# Patient Record
Sex: Male | Born: 1960 | Race: White | Hispanic: No | Marital: Single | Smoking: Former smoker
Health system: Southern US, Community
[De-identification: ages and names within clinical notes are randomized; demographics above are authoritative.]

## PROBLEM LIST (undated history)

## (undated) DIAGNOSIS — J449 Chronic obstructive pulmonary disease, unspecified: Secondary | ICD-10-CM

## (undated) DIAGNOSIS — F909 Attention-deficit hyperactivity disorder, unspecified type: Secondary | ICD-10-CM

## (undated) DIAGNOSIS — G8929 Other chronic pain: Secondary | ICD-10-CM

## (undated) DIAGNOSIS — I517 Cardiomegaly: Secondary | ICD-10-CM

## (undated) DIAGNOSIS — F209 Schizophrenia, unspecified: Secondary | ICD-10-CM

## (undated) DIAGNOSIS — K7290 Hepatic failure, unspecified without coma: Secondary | ICD-10-CM

## (undated) DIAGNOSIS — I10 Essential (primary) hypertension: Secondary | ICD-10-CM

## (undated) DIAGNOSIS — E119 Type 2 diabetes mellitus without complications: Secondary | ICD-10-CM

## (undated) DIAGNOSIS — J45909 Unspecified asthma, uncomplicated: Secondary | ICD-10-CM

## (undated) DIAGNOSIS — M542 Cervicalgia: Secondary | ICD-10-CM

## (undated) DIAGNOSIS — N289 Disorder of kidney and ureter, unspecified: Secondary | ICD-10-CM

## (undated) DIAGNOSIS — M549 Dorsalgia, unspecified: Secondary | ICD-10-CM

## (undated) HISTORY — PX: BACK SURGERY: SHX140

## (undated) HISTORY — PX: SPINAL FUSION: SHX223

## (undated) HISTORY — PX: NECK SURGERY: SHX720

---

## 1998-04-04 ENCOUNTER — Ambulatory Visit (HOSPITAL_COMMUNITY): Admission: RE | Admit: 1998-04-04 | Discharge: 1998-04-04 | Payer: Self-pay | Admitting: Neurosurgery

## 1998-04-04 ENCOUNTER — Encounter: Payer: Self-pay | Admitting: Neurosurgery

## 1998-04-20 ENCOUNTER — Ambulatory Visit (HOSPITAL_COMMUNITY): Admission: RE | Admit: 1998-04-20 | Discharge: 1998-04-20 | Payer: Self-pay | Admitting: Neurosurgery

## 1998-04-20 ENCOUNTER — Encounter: Payer: Self-pay | Admitting: Neurosurgery

## 1998-05-08 ENCOUNTER — Ambulatory Visit (HOSPITAL_COMMUNITY): Admission: RE | Admit: 1998-05-08 | Discharge: 1998-05-08 | Payer: Self-pay | Admitting: Neurosurgery

## 1998-05-08 ENCOUNTER — Encounter: Payer: Self-pay | Admitting: Neurosurgery

## 1998-06-11 ENCOUNTER — Ambulatory Visit (HOSPITAL_COMMUNITY): Admission: RE | Admit: 1998-06-11 | Discharge: 1998-06-11 | Payer: Self-pay | Admitting: Neurosurgery

## 1998-06-11 ENCOUNTER — Encounter: Payer: Self-pay | Admitting: Neurosurgery

## 2006-03-03 ENCOUNTER — Inpatient Hospital Stay: Payer: Self-pay | Admitting: Internal Medicine

## 2006-05-31 ENCOUNTER — Inpatient Hospital Stay (HOSPITAL_COMMUNITY): Admission: RE | Admit: 2006-05-31 | Discharge: 2006-06-01 | Payer: Self-pay | Admitting: Neurosurgery

## 2006-07-04 ENCOUNTER — Encounter: Admission: RE | Admit: 2006-07-04 | Discharge: 2006-07-04 | Payer: Self-pay | Admitting: Neurosurgery

## 2006-09-20 ENCOUNTER — Encounter: Payer: Self-pay | Admitting: Neurosurgery

## 2006-10-16 ENCOUNTER — Encounter: Payer: Self-pay | Admitting: Neurosurgery

## 2006-11-28 ENCOUNTER — Encounter: Payer: Self-pay | Admitting: Neurosurgery

## 2007-01-16 ENCOUNTER — Encounter: Admission: RE | Admit: 2007-01-16 | Discharge: 2007-01-16 | Payer: Self-pay | Admitting: Neurosurgery

## 2008-11-26 ENCOUNTER — Emergency Department: Payer: Self-pay | Admitting: Emergency Medicine

## 2008-12-07 ENCOUNTER — Emergency Department (HOSPITAL_COMMUNITY): Admission: EM | Admit: 2008-12-07 | Discharge: 2008-12-07 | Payer: Self-pay | Admitting: Emergency Medicine

## 2008-12-15 IMAGING — RF DG CERVICAL SPINE 2 OR 3 VIEWS
1 series · 2 of 2 positions shown · non-contrast
Comparison: none

CLINICAL DATA: Herniated disc

Cervical spine Intraoperative:
Two spot images from intraoperative fluoroscopy document changes of anterior
cervical discectomy and fusion C4-C7. The lower cervical levels are not well
profiled.

[Series 1: run · 2 of 2 slices shown]
[im 1/2]
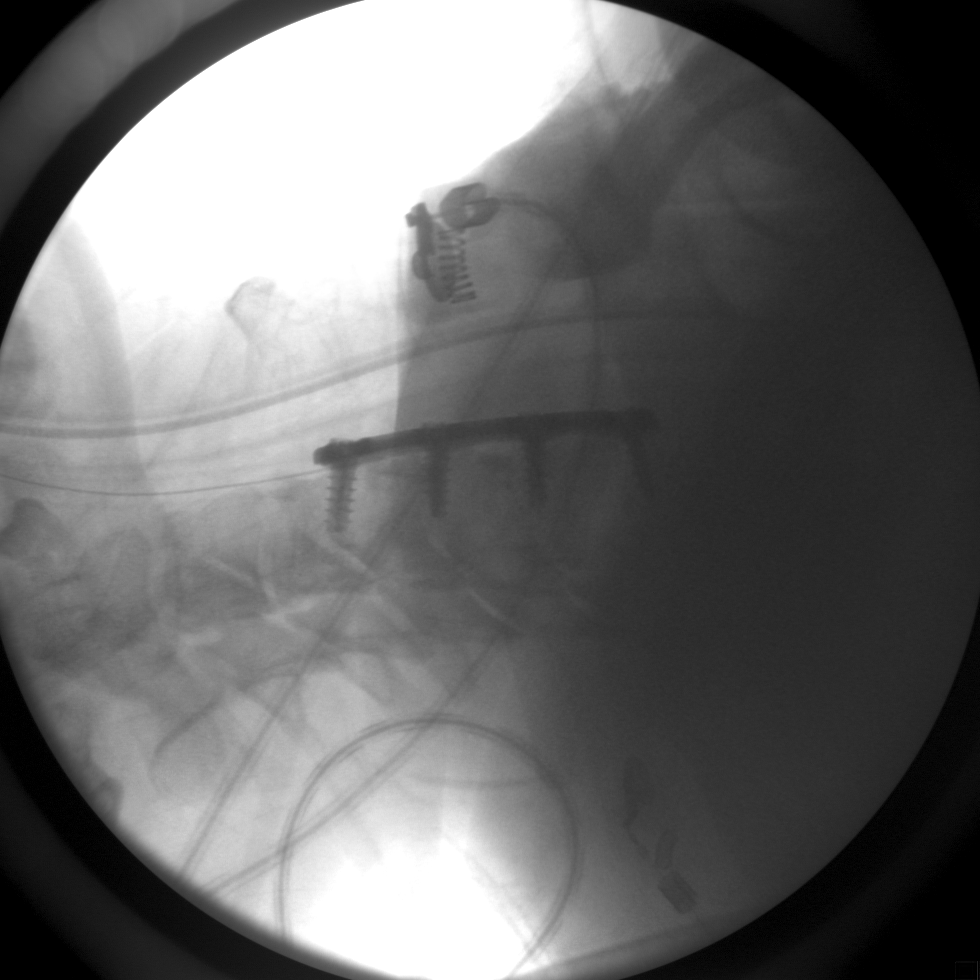
[im 2/2]
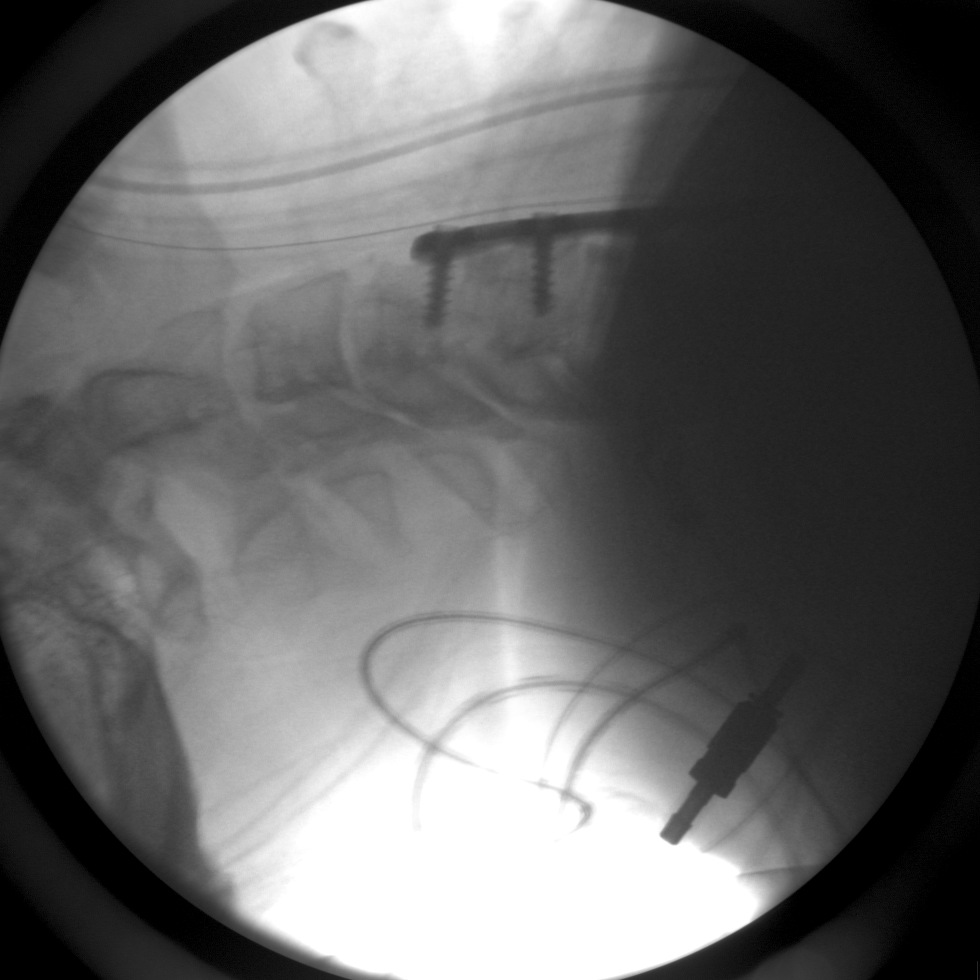

[2 of 2 positions shown; findings below may reference images not displayed]

IMPRESSION: 1. Anterior cervical discectomy and fusion C4-C7.

## 2009-01-27 ENCOUNTER — Encounter: Admission: RE | Admit: 2009-01-27 | Discharge: 2009-01-27 | Payer: Self-pay | Admitting: Neurosurgery

## 2009-04-20 ENCOUNTER — Emergency Department: Payer: Self-pay | Admitting: Internal Medicine

## 2009-09-11 ENCOUNTER — Ambulatory Visit: Payer: Self-pay | Admitting: Neurosurgery

## 2009-09-17 ENCOUNTER — Ambulatory Visit: Payer: Self-pay | Admitting: Neurosurgery

## 2009-10-26 ENCOUNTER — Ambulatory Visit: Payer: Self-pay | Admitting: Pain Medicine

## 2009-11-26 ENCOUNTER — Inpatient Hospital Stay: Payer: Self-pay | Admitting: Psychiatry

## 2009-12-22 ENCOUNTER — Emergency Department: Payer: Self-pay | Admitting: Emergency Medicine

## 2010-04-20 ENCOUNTER — Ambulatory Visit: Payer: Self-pay | Admitting: Rheumatology

## 2010-05-20 LAB — URINALYSIS, ROUTINE W REFLEX MICROSCOPIC
Glucose, UA: 100 mg/dL — AB
Ketones, ur: 15 mg/dL — AB
Specific Gravity, Urine: 1.015 (ref 1.005–1.030)
pH: 7 (ref 5.0–8.0)

## 2010-05-20 LAB — CBC
HCT: 37.5 % — ABNORMAL LOW (ref 39.0–52.0)
Hemoglobin: 13.3 g/dL (ref 13.0–17.0)
RBC: 4.04 MIL/uL — ABNORMAL LOW (ref 4.22–5.81)
WBC: 7.1 10*3/uL (ref 4.0–10.5)

## 2010-05-20 LAB — COMPREHENSIVE METABOLIC PANEL
ALT: 32 U/L (ref 0–53)
CO2: 28 mEq/L (ref 19–32)
Calcium: 9.3 mg/dL (ref 8.4–10.5)
Creatinine, Ser: 0.91 mg/dL (ref 0.4–1.5)
GFR calc non Af Amer: >60 mL/min (ref >60)
Glucose, Bld: 156 mg/dL — ABNORMAL HIGH (ref 70–99)
Total Bilirubin: 0.8 mg/dL (ref 0.3–1.2)
Total Protein: 6.7 g/dL (ref 6.0–8.3)

## 2010-05-20 LAB — DIFFERENTIAL
Basophils Absolute: 0 10*3/uL (ref 0.0–0.1)
Eosinophils Absolute: 0.1 10*3/uL (ref 0.0–0.7)
Eosinophils Relative: 2 % (ref 0–5)
Lymphocytes Relative: 21 % (ref 12–46)
Monocytes Absolute: 0.6 10*3/uL (ref 0.1–1.0)
Monocytes Relative: 9 % (ref 3–12)

## 2010-07-02 NOTE — Op Note (Signed)
Matthew Montes, Matthew Montes               ACCOUNT NO.:  [id number]   MEDICAL RECORD NO.:  [id number]          PATIENT TYPE:  AMB   LOCATION:  SDS                          FACILITY:  [facility]   PHYSICIAN:  Donalee Citrin, M.D.        DATE OF BIRTH:  21-Dec-1960   DATE OF PROCEDURE:  05/31/2006  DATE OF DISCHARGE:                               OPERATIVE REPORT   PREOPERATIVE DIAGNOSIS:  Cervical spinal stenosis with radiculopathy  from ruptured disk and spondylosis at C4-5, C5-6 and C6-7, with left  greater the right radiculopathy at C5, C6 and C7.   PROCEDURE:  Anterior cervical diskectomies and fusion at C4-5, C5-6, C6-  7, using a 7-mm allograft wedge at C4-5, a 7-mm allograft wedge at C5-6,  and an 8-mm allograft wedge at C6-7, and a 60 mm Venture plate with  eight 13-mm variable angle screws.   SURGEON:  Donalee Citrin, MD   ASSISTANT:  Tia Alert, MD   ANESTHESIA:  General endotracheal.   HISTORY OF PRESENT ILLNESS:  The patient very pleasant 50 year old  gentleman who sustained a work-related injury resulting in severe neck  and predominantly left greater than right arm pain radiating down  through his triceps into his forearm and the thumb and first two fingers  of his left hand.  The patient on physical exam had weakness of both  triceps at about 4 to 4+/5.  It was 4/5 on the left side and 4+/5 on the  right.  MRI scan showed severe spinal stenosis and spinal cord  compression due to ruptured disk at C5-6 and C6-7 as well as central  disk at C4-5 resulting in severe spinal stenosis at that level too.  The  patient failed all forms of conservative treatment and with his physical  exam and failure of conservative treatment and MRI findings, the patient  is recommended a 3-level anterior cervical diskectomy and fusion.  The  risks and benefits of the operation were explained to the patient and he  has understands and agrees to proceed forward.   The patient was brought to the OR and was  induced under general  anesthesia and positioned supine with the neck in slight extension in 5  pounds of halter traction.  The right side of his neck was prepped and  draped in the usual sterile fashion.  Preoperative x-ray localized the  C6 vertebral body.  A curvilinear incision was made just off the midline  to the anterior border of the sternocleidomastoid and the superficial  layer of the platysma was dissected out and divided longitudinally.  The  avascular plane between the sternocleidomastoid and the strap muscles  was developed down to the prevertebral fascia.  The prevertebral fascia  was dissected with Kitners.  Intraoperative x-ray confirmed localization  of the C5-6 level.  The anterior cervical spine was found to be markedly  spondylotic with a large calcified disks at C5-6 and C6-7.  These were  bitten off with a Leksell rongeur.  In addition, there was a large  anterior calcified disk at C4-5.  This was also bitten  off and removed  with a Leksell rongeur.  After the longus colli was reflected laterally,  the self-retaining retractor was placed and both interspaces were  identified and marked, confirmed by x-ray, and annulotomies were made  with a 15-blade scalpel.  Using a combination of a 2 and 3 mm Kerrison  punch, a large anterior osteophytic complex of calcified disk was  removed from the C4-5, C5-6 and C6-7 disk space.  Then using a BA  curette the disk space was scraped and a high-speed drill was used to  drill down all three disk spaces to posterior annulus and posterior  osteophytic complexes.  At this point the operating microscope was  draped and brought into the field to provide microscopic illumination,  first at C6-7.  This disk was noted be markedly disrupted with complete  loss of the disk integrity consistent with a traumatic injury.  This  disk was scraped again with a BA curette.  Using a 2 and 3 Kerrison  punch, the remainder of the post annulus was  removed.  Large ruptured  disk migrating subligamentous compressing the thecal sac and proximal  right C7 neural foramina was all removed in piecemeal fashion with a 2  mm Kerrison punch as well as a Black nerve hook.  This aggressive  underbiting of the endplates at both C6 and C7 was carried out and after  the endplates were underbitten, the central canal was decompressed.  Both proximal C7 pedicles were identified.  C7 neural foramina were also  identified and decompressed.  Both C7 nerve roots were explored and  noted to be widely decompressed out their foramen with an angled nerve  hook and after the end of the diskectomy and decompression of the nerve,  this was packed with Gelfoam and attention was taken to C4-5.  Again,  using a high-speed drill the undersurface of the posterior annulus was  drilled down and the undersurface the endplates of C4 and C5 vertebral  bodies were aggressively underbitten with a 2 mm Kerrison punch.  Posterior longitudinal ligament was immediate identified and also  removed in piecemeal fashion with a 1 and 2 mm Kerrison punch,  decompressing the central canal with aggressive underbiting of the  endplates.  Both proximal C5 pedicles were also palpated and the C5  nerve roots were identified, decompressed out their foramen, explored  with angled nerve hook, and noted to have no further stenosis.  After  adequate decompression had been attained at this level, it was packed  with Gelfoam and attention was taken to C5-6.  At C5-6 a large soft disk  herniation compressing the left-sided spinal cord and left C6 nerve root  were also identified.  This was teased away with a blunt nerve hook.  The posterior longitudinal ligament was removed in a piecemeal fashion.  Aggressive underbiting of the endplates again was performed at C5 and  C6, decompressing the central canal.  Both C6 pedicles were also identified.  Both C6 nerve roots were decompressed flush with  the  pedicle.  At the end of the diskectomy here, the endplates were scraped,  copiously irrigated.  A size 7 graft was inserted 1-2 mm deep to the  anterior vertebral body line.  Then a size 8 graft was subsequent  inserted at C6-7 and a size 7 at C4-5.  After all grafts were inserted,  a 60 mm Venture plate was sized up and all screws were drilled and  placed.  All screws had excellent purchase.  Locking mechanism was  engaged.  Postop fluoroscopy confirmed good position of plate, screws  and bone graft.  The wound was then copiously irrigated and meticulous  hemostasis was maintained.  Surgifoam was applied within the epidural  space and irrigated out and with this, meticulous hemostasis was  achieved.  Then the platysma was reapproximate with 3-0 interrupted  Vicryl and the skin was closed with a running 4-0 subcuticular.  Benzoin  and Steri-Strips applied.  The patient went to the recovery room in  stable condition.  At the end of the procedure all sponge, needle and  instrument counts were correct.           ______________________________  Donalee Citrin, M.D.     GC/MEDQ  D:  05/31/2006  T:  06/01/2006  Job:  [id number]

## 2010-07-07 ENCOUNTER — Encounter (HOSPITAL_COMMUNITY)
Admission: RE | Admit: 2010-07-07 | Discharge: 2010-07-07 | Disposition: A | Discharge status: Home / Self Care | Payer: Medicare Other | Source: Ambulatory Visit | Attending: Neurosurgery | Admitting: Neurosurgery

## 2010-07-07 LAB — CBC
HCT: 38.1 % — ABNORMAL LOW (ref 39.0–52.0)
MCH: 30 pg (ref 26.0–34.0)
RDW: 13.6 % (ref 11.5–15.5)

## 2010-07-07 LAB — BASIC METABOLIC PANEL
BUN: 10 mg/dL (ref 6–23)
Chloride: 98 mEq/L (ref 96–112)
Glucose, Bld: 252 mg/dL — ABNORMAL HIGH (ref 70–99)
Potassium: 4.2 mEq/L (ref 3.5–5.1)

## 2010-07-07 LAB — TYPE AND SCREEN: Antibody Screen: NEGATIVE

## 2010-07-07 LAB — SURGICAL PCR SCREEN: Staphylococcus aureus: NEGATIVE

## 2010-07-09 ENCOUNTER — Ambulatory Visit (HOSPITAL_COMMUNITY)
Admission: RE | Admit: 2010-07-09 | Discharge: 2010-07-09 | Disposition: A | Discharge status: Home / Self Care | Payer: Medicare Other | Source: Ambulatory Visit | Attending: Neurosurgery | Admitting: Neurosurgery

## 2010-07-09 ENCOUNTER — Inpatient Hospital Stay (HOSPITAL_COMMUNITY): Payer: Medicare Other

## 2010-07-09 ENCOUNTER — Inpatient Hospital Stay (HOSPITAL_COMMUNITY)
Admission: RE | Admit: 2010-07-09 | Discharge: 2010-07-20 | DRG: 460 | Disposition: A | Discharge status: Home Health | Payer: Medicare Other | Source: Ambulatory Visit | Attending: Neurosurgery | Admitting: Neurosurgery

## 2010-07-09 ENCOUNTER — Other Ambulatory Visit (HOSPITAL_COMMUNITY): Payer: Self-pay | Admitting: Neurosurgery

## 2010-07-09 DIAGNOSIS — M5416 Radiculopathy, lumbar region: Secondary | ICD-10-CM

## 2010-07-09 DIAGNOSIS — G4730 Sleep apnea, unspecified: Secondary | ICD-10-CM | POA: Diagnosis present

## 2010-07-09 DIAGNOSIS — F209 Schizophrenia, unspecified: Secondary | ICD-10-CM | POA: Diagnosis present

## 2010-07-09 DIAGNOSIS — M545 Low back pain, unspecified: Secondary | ICD-10-CM

## 2010-07-09 DIAGNOSIS — M47817 Spondylosis without myelopathy or radiculopathy, lumbosacral region: Principal | ICD-10-CM | POA: Diagnosis present

## 2010-07-09 DIAGNOSIS — D649 Anemia, unspecified: Secondary | ICD-10-CM | POA: Diagnosis not present

## 2010-07-09 DIAGNOSIS — M51379 Other intervertebral disc degeneration, lumbosacral region without mention of lumbar back pain or lower extremity pain: Secondary | ICD-10-CM | POA: Diagnosis present

## 2010-07-09 DIAGNOSIS — E119 Type 2 diabetes mellitus without complications: Secondary | ICD-10-CM | POA: Diagnosis present

## 2010-07-09 DIAGNOSIS — K219 Gastro-esophageal reflux disease without esophagitis: Secondary | ICD-10-CM | POA: Diagnosis present

## 2010-07-09 DIAGNOSIS — M47816 Spondylosis without myelopathy or radiculopathy, lumbar region: Secondary | ICD-10-CM

## 2010-07-09 DIAGNOSIS — M5137 Other intervertebral disc degeneration, lumbosacral region: Secondary | ICD-10-CM | POA: Diagnosis present

## 2010-07-09 LAB — GLUCOSE, CAPILLARY
Glucose-Capillary: 241 mg/dL — ABNORMAL HIGH (ref 70–99)
Glucose-Capillary: 237 mg/dL — ABNORMAL HIGH (ref 70–99)

## 2010-07-10 LAB — GLUCOSE, CAPILLARY
Glucose-Capillary: 255 mg/dL — ABNORMAL HIGH (ref 70–99)
Glucose-Capillary: 176 mg/dL — ABNORMAL HIGH (ref 70–99)
Glucose-Capillary: 108 mg/dL — ABNORMAL HIGH (ref 70–99)

## 2010-07-11 LAB — GLUCOSE, CAPILLARY
Glucose-Capillary: 148 mg/dL — ABNORMAL HIGH (ref 70–99)
Glucose-Capillary: 237 mg/dL — ABNORMAL HIGH (ref 70–99)

## 2010-07-12 ENCOUNTER — Inpatient Hospital Stay (HOSPITAL_COMMUNITY): Payer: Medicare Other

## 2010-07-12 LAB — CBC
HCT: 26.7 % — ABNORMAL LOW (ref 39.0–52.0)
Hemoglobin: 8.1 g/dL — ABNORMAL LOW (ref 13.0–17.0)
Hemoglobin: 9 g/dL — ABNORMAL LOW (ref 13.0–17.0)
MCH: 30.7 pg (ref 26.0–34.0)
MCHC: 34 g/dL (ref 30.0–36.0)
MCHC: 33.7 g/dL (ref 30.0–36.0)
Platelets: 150 10*3/uL (ref 150–400)
RBC: 2.97 MIL/uL — ABNORMAL LOW (ref 4.22–5.81)
WBC: 8.9 10*3/uL (ref 4.0–10.5)

## 2010-07-12 LAB — URINE MICROSCOPIC-ADD ON

## 2010-07-12 LAB — URINALYSIS, ROUTINE W REFLEX MICROSCOPIC
Bilirubin Urine: NEGATIVE
Glucose, UA: 100 mg/dL — AB
Ketones, ur: NEGATIVE mg/dL
Leukocytes, UA: NEGATIVE
Nitrite: NEGATIVE
Specific Gravity, Urine: 1.013 (ref 1.005–1.030)
pH: 5.5 (ref 5.0–8.0)

## 2010-07-12 LAB — GLUCOSE, CAPILLARY
Glucose-Capillary: 173 mg/dL — ABNORMAL HIGH (ref 70–99)
Glucose-Capillary: 113 mg/dL — ABNORMAL HIGH (ref 70–99)
Glucose-Capillary: 62 mg/dL — ABNORMAL LOW (ref 70–99)

## 2010-07-13 LAB — URINE CULTURE
Colony Count: NO GROWTH
Culture  Setup Time: 201205280356
Culture: NO GROWTH

## 2010-07-13 LAB — CROSSMATCH
ABO/RH(D): A POS
Unit division: 0

## 2010-07-13 LAB — POCT I-STAT 4, (NA,K, GLUC, HGB,HCT)
Hemoglobin: 10.2 g/dL — ABNORMAL LOW (ref 13.0–17.0)
Sodium: 137 mEq/L (ref 135–145)

## 2010-07-13 LAB — GLUCOSE, CAPILLARY: Glucose-Capillary: 123 mg/dL — ABNORMAL HIGH (ref 70–99)

## 2010-07-14 LAB — GLUCOSE, CAPILLARY
Glucose-Capillary: 77 mg/dL (ref 70–99)
Glucose-Capillary: 75 mg/dL (ref 70–99)
Glucose-Capillary: 117 mg/dL — ABNORMAL HIGH (ref 70–99)

## 2010-07-16 LAB — GLUCOSE, CAPILLARY

## 2010-07-17 LAB — GLUCOSE, CAPILLARY
Glucose-Capillary: 156 mg/dL — ABNORMAL HIGH (ref 70–99)
Glucose-Capillary: 76 mg/dL (ref 70–99)

## 2010-07-18 LAB — CULTURE, BLOOD (ROUTINE X 2)
Culture  Setup Time: 201205280956
Culture: NO GROWTH

## 2010-07-18 LAB — GLUCOSE, CAPILLARY: Glucose-Capillary: 97 mg/dL (ref 70–99)

## 2010-07-19 LAB — GLUCOSE, CAPILLARY: Glucose-Capillary: 110 mg/dL — ABNORMAL HIGH (ref 70–99)

## 2010-07-20 LAB — GLUCOSE, CAPILLARY
Glucose-Capillary: 79 mg/dL (ref 70–99)
Glucose-Capillary: 56 mg/dL — ABNORMAL LOW (ref 70–99)
Glucose-Capillary: 107 mg/dL — ABNORMAL HIGH (ref 70–99)

## 2010-07-22 NOTE — Op Note (Signed)
Matthew Montes, Matthew Montes               ACCOUNT NO.:  [id number]  MEDICAL RECORD NO.:  [id number]           PATIENT TYPE:  O  LOCATION:  XRAY                         FACILITY:  [facility]  PHYSICIAN:  Coletta Memos, M.D.     DATE OF BIRTH:  12-13-1960  DATE OF PROCEDURE: DATE OF DISCHARGE:  07/09/2010                              OPERATIVE REPORT   PREOPERATIVE DIAGNOSES:  Lumbar spondylosis, lumbar degenerative disk disease L5-S1, low back pain, lumbar radiculopathy.  POSTOPERATIVE DIAGNOSES:  Lumbar spondylosis, lumbar degenerative disk disease L5-S1, low back pain, lumbar radiculopathy.  PROCEDURE: 1. L5-S1 posterior lumbar interbody arthrodesis, 10-mm PEEK cage     filled with morselized autograft. 2. Posterolateral arthrodesis L5-S1 with morselized allograft. 3. Nonsegmental pedicle screw fixation L5-S1, screws placed on the     left. 4. Spire plate placement in the spinous processes of L5-S1.  INDICATIONS:  Matthew Montes is a gentleman who had surgery in the distant past in 1995 at L5-S1.  He continued to have significant pain in his lower back.  Reviewing his scan, he has calcified disk and osteophyte which was causing significant narrowing of the neural foramen at L5-S1. This was on the left side.  Most of his problems was extending from the left side.  I did not believe that fusion would offer a cure.  I did believe it could offer him some help with regard to his pain.  We discussed it and he agreed to undergo the fusion.  OPERATIVE NOTE:  Matthew Montes was brought to the operating room, intubated, and placed under general anesthetic.  A Foley catheter was placed under sterile conditions.  He was rolled prone onto a Jackson table and all pressure points were properly padded.  His back was prepped and he was draped in a sterile fashion.  I opened his old incision and took this down to the thoracolumbar fascia.  I then exposed the lamina of L4, L5, and S1 bilaterally.  I then  proceeded with laminectomy on the left side at L5.  With Dr. Fredrich Birks assistance, we completed the laminectomy and with microdissection worked around the nerve root.  He had very large osteophyte which was tenting the thecal sac.  I able to take part of that down to get into the disk space. However, he was so sclerotic, and there was very little movement in the disk space that even I opened on the opposite side, the right side, I still was not able to gain great access to all of the disk space.  Since it was so stiff, I felt that what I had achieved on the left side would be sufficient.  I was able to place a 10-mm cage on that side to complete the posterior lumbar interbody arthrodesis.  That was filled with morselized autograft, and then I did place morselized allograft around the cage.  I did not believe it was worth the trauma to the right L5 or S1 nerve roots to try to place a cage on that side, but I did decompress the L5 and S1 roots on the right side.  I turned my attention to the  pedicle screws and placed two screws, one at L5 and one at S1 on the left side and I was able to distract the space to some degree.  I placed screws and actual rod before I placed the cage there.  Having done that, I worked again to make sure I had taken down as much bone as I possibly could anterior to the thecal sac. There was just a bony ridge that ran to essentially entire the course and it was at the edge of the bone so it was not an overhanging part that could just be removed with Kerrison punches were drilled for example.  Nevertheless, thecal sac was fully decompressed as were all four nerve roots.  I connected rods, secured that in position, and placed the spire plate.  Spire plate was placed without difficulty holding the spinous processes of L5 and S1.  I then irrigated the wound. I then closed the wound in layered fashion without difficulty.  I applied Dermabond for sterile dressing.           ______________________________ Coletta Memos, M.D.     KC/MEDQ  D:  07/09/2010  T:  07/10/2010  Job:  [id number]  Electronically Signed by Coletta Memos M.D. on 07/22/2010 03:16:06 PM

## 2010-07-22 NOTE — Discharge Summary (Signed)
  NAMETHEADOR, JEZEWSKI NO.:  [id number]  MEDICAL RECORD NO.:  [id number]  LOCATION:  XRAY                         FACILITY:  [facility]  PHYSICIAN:  Coletta Memos, M.D.     DATE OF BIRTH:  01-03-1961  DATE OF ADMISSION:  07/09/2010 DATE OF DISCHARGE:  07/09/2010                        DISCHARGE SUMMARY - REFERRING   ADMITTING DIAGNOSIS:  Lumbar degenerative disk disease, L5-S1.  DISCHARGE DIAGNOSIS:  Lumbar degenerative disk disease, L5-S1.  PROCEDURE:  L5-S1 posterior lumbar interbody arthrodesis with pedicle screws, spider plate placement with both auto and allograft.  COMPLICATIONS:  None.  DISCHARGE STATUS:  Alive and well.  DETAILS OF PROCEDURE:  Matthew Montes hospitalization was characterized by probable psychotic incident.  He had been taken off his mental health medications for a brief period of time and had acute onset of confusion in the light.  With reinstitution of his medications, he was noted to be doing much better.  He will go home with his sister, have Home Health, PT, OT and Psychiatric Care.  He does have history of psychiatric disorder.  He on discharge also has diagnoses of expected blood loss anemia secondary to surgery, which was improved with transfusion and he had acute psychosis secondary to being off medications.  He is ambulating, voiding without difficulty.  His wound is clean, dry, no signs of infection.  He had previous surgery at the L5-S1 level.  He had an osteophyte there.  I was able to decompress the nerve root.  Disk was quite degenerated at that level and hopefully, the fusion will ameliorate some of that pain also.  I will see him in the office in 3 to 4 weeks.          ______________________________ Coletta Memos, M.D.     KC/MEDQ  D:  07/20/2010  T:  07/20/2010  Job:  [id number]  Electronically Signed by Coletta Memos M.D. on 07/22/2010 03:16:10 PM

## 2011-08-27 ENCOUNTER — Emergency Department: Payer: Self-pay | Admitting: Emergency Medicine

## 2011-08-27 LAB — CBC
HGB: 11.4 g/dL — ABNORMAL LOW (ref 13.0–18.0)
MCH: 30.7 pg (ref 26.0–34.0)
Platelet: 207 10*3/uL (ref 150–440)
RBC: 3.72 10*6/uL — ABNORMAL LOW (ref 4.40–5.90)
WBC: 6.1 10*3/uL (ref 3.8–10.6)

## 2011-08-27 LAB — ETHANOL
Ethanol %: 0.267 % — ABNORMAL HIGH (ref 0.000–0.080)
Ethanol: 267 mg/dL

## 2011-08-27 LAB — COMPREHENSIVE METABOLIC PANEL
Albumin: 3.3 g/dL — ABNORMAL LOW (ref 3.4–5.0)
Alkaline Phosphatase: 68 U/L (ref 50–136)
BUN: 13 mg/dL (ref 7–18)
Calcium, Total: 8.8 mg/dL (ref 8.5–10.1)
Chloride: 102 mmol/L (ref 98–107)
Creatinine: 1.1 mg/dL (ref 0.60–1.30)
EGFR (Non-African Amer.): >60
SGOT(AST): 21 U/L (ref 15–37)
SGPT (ALT): 20 U/L
Sodium: 140 mmol/L (ref 136–145)

## 2011-08-27 LAB — ACETAMINOPHEN LEVEL: Acetaminophen: <2 ug/mL

## 2011-08-28 LAB — URINALYSIS, COMPLETE
Ketone: NEGATIVE
Leukocyte Esterase: NEGATIVE
Nitrite: NEGATIVE
Ph: 5 (ref 4.5–8.0)
Protein: NEGATIVE
RBC,UR: NONE SEEN /HPF (ref 0–5)
WBC UR: NONE SEEN /HPF (ref 0–5)

## 2013-04-16 ENCOUNTER — Ambulatory Visit: Payer: Self-pay | Admitting: Gastroenterology

## 2013-04-17 LAB — PATHOLOGY REPORT

## 2013-10-04 ENCOUNTER — Emergency Department: Payer: Self-pay | Admitting: Emergency Medicine

## 2013-10-04 LAB — URINALYSIS, COMPLETE
Bacteria: NONE SEEN
Bilirubin,UR: NEGATIVE
Ketone: NEGATIVE
Leukocyte Esterase: NEGATIVE
Nitrite: NEGATIVE
PH: 7 (ref 4.5–8.0)
Protein: NEGATIVE
RBC,UR: NONE SEEN /HPF (ref 0–5)
Specific Gravity: 1.006 (ref 1.003–1.030)

## 2013-10-04 LAB — CBC
HCT: 39.6 % — AB (ref 40.0–52.0)
HGB: 13.3 g/dL (ref 13.0–18.0)
MCH: 31.2 pg (ref 26.0–34.0)
MCHC: 33.4 g/dL (ref 32.0–36.0)
MCV: 93 fL (ref 80–100)
PLATELETS: 196 10*3/uL (ref 150–440)
RBC: 4.25 10*6/uL — AB (ref 4.40–5.90)
RDW: 14.3 % (ref 11.5–14.5)
WBC: 7.4 10*3/uL (ref 3.8–10.6)

## 2013-10-04 LAB — COMPREHENSIVE METABOLIC PANEL
ALK PHOS: 75 U/L
ANION GAP: 14 (ref 7–16)
Albumin: 3.5 g/dL (ref 3.4–5.0)
BUN: 15 mg/dL (ref 7–18)
Bilirubin,Total: 0.6 mg/dL (ref 0.2–1.0)
CALCIUM: 9.8 mg/dL (ref 8.5–10.1)
CO2: 26 mmol/L (ref 21–32)
CREATININE: 1.51 mg/dL — AB (ref 0.60–1.30)
Chloride: 101 mmol/L (ref 98–107)
GFR CALC NON AF AMER: 52 — AB
GLUCOSE: 194 mg/dL — AB (ref 65–99)
OSMOLALITY: 287 (ref 275–301)
POTASSIUM: 3.8 mmol/L (ref 3.5–5.1)
SGPT (ALT): 2106 U/L — ABNORMAL HIGH
Sodium: 141 mmol/L (ref 136–145)
TOTAL PROTEIN: 7.1 g/dL (ref 6.4–8.2)

## 2013-10-04 LAB — TROPONIN I: Troponin-I: <0.02 ng/mL

## 2013-10-04 LAB — PROTIME-INR
INR: 1.1
Prothrombin Time: 14.4 secs (ref 11.5–14.7)

## 2013-10-04 LAB — LIPASE, BLOOD: Lipase: 322 U/L (ref 73–393)

## 2013-11-03 ENCOUNTER — Emergency Department: Payer: Self-pay | Admitting: Internal Medicine

## 2013-11-03 LAB — COMPREHENSIVE METABOLIC PANEL
ALK PHOS: 99 U/L
ALT: 19 U/L
ANION GAP: 13 (ref 7–16)
AST: 10 U/L — AB (ref 15–37)
Albumin: 4.6 g/dL (ref 3.4–5.0)
BUN: 12 mg/dL (ref 7–18)
Bilirubin,Total: 0.5 mg/dL (ref 0.2–1.0)
CREATININE: 1.3 mg/dL (ref 0.60–1.30)
Calcium, Total: 10.7 mg/dL — ABNORMAL HIGH (ref 8.5–10.1)
Chloride: 103 mmol/L (ref 98–107)
Co2: 22 mmol/L (ref 21–32)
Glucose: 163 mg/dL — ABNORMAL HIGH (ref 65–99)
Osmolality: 279 (ref 275–301)
Potassium: 3.8 mmol/L (ref 3.5–5.1)
Sodium: 138 mmol/L (ref 136–145)
Total Protein: 8.7 g/dL — ABNORMAL HIGH (ref 6.4–8.2)

## 2013-11-03 LAB — CBC
HCT: 45.6 % (ref 40.0–52.0)
HGB: 15.3 g/dL (ref 13.0–18.0)
MCH: 30.6 pg (ref 26.0–34.0)
MCHC: 33.5 g/dL (ref 32.0–36.0)
MCV: 91 fL (ref 80–100)
PLATELETS: 284 10*3/uL (ref 150–440)
RBC: 5 10*6/uL (ref 4.40–5.90)
RDW: 14 % (ref 11.5–14.5)
WBC: 9.2 10*3/uL (ref 3.8–10.6)

## 2013-11-03 LAB — URINALYSIS, COMPLETE
BILIRUBIN, UR: NEGATIVE
BLOOD: NEGATIVE
Bacteria: NONE SEEN
Leukocyte Esterase: NEGATIVE
NITRITE: NEGATIVE
PH: 8 (ref 4.5–8.0)
Protein: NEGATIVE
RBC,UR: NONE SEEN /HPF (ref 0–5)
Specific Gravity: 1.005 (ref 1.003–1.030)
Squamous Epithelial: <1
WBC UR: NONE SEEN /HPF (ref 0–5)

## 2013-11-03 LAB — LIPASE, BLOOD: LIPASE: 255 U/L (ref 73–393)

## 2013-11-03 LAB — TROPONIN I: Troponin-I: <0.02 ng/mL

## 2014-06-07 NOTE — Consult Note (Signed)
I got called by ED about Mr Matthew Montes. He has AST, ALT > 2000 with no clear etiology.  I am worried about him going into acute liver failure and he would [facility] an [facility] in the case he does go into ALF.   I have asked for an ED to ED to transfer to an academic center. Please call with any questions.    Electronic Signatures: Dow Adolphein, Eliot Bencivenga (MD)  (Signed on 21-Aug-15 17:21)  Authored  Last Updated: 21-Aug-15 17:21 by Dow Adolphein, Satish Hammers (MD)

## 2015-09-08 ENCOUNTER — Encounter: Payer: Self-pay | Admitting: Physician Assistant

## 2015-09-08 ENCOUNTER — Emergency Department
Admission: EM | Admit: 2015-09-08 | Discharge: 2015-09-08 | Disposition: A | Discharge status: Home / Self Care | Payer: Medicare HMO | Attending: Emergency Medicine | Admitting: Emergency Medicine

## 2015-09-08 DIAGNOSIS — H6121 Impacted cerumen, right ear: Secondary | ICD-10-CM | POA: Diagnosis not present

## 2015-09-08 DIAGNOSIS — H9201 Otalgia, right ear: Secondary | ICD-10-CM | POA: Diagnosis present

## 2015-09-08 NOTE — Discharge Instructions (Signed)
You had decreased hearing and ear pain due to ear wax pressing up against the eardrum on the right ear. You have had your ear washed, and the wax has been removed, your hearing has been restored. Your ear drum is intact, and there is no sign of infection. You may experience some mild canal irritation over the next day or so. See your provider for ear flushes in the future.

## 2015-09-08 NOTE — ED Provider Notes (Signed)
[facility] Emergency Department Provider Note ____________________________________________  Time seen: 9  I have reviewed the triage vital signs and the nursing notes.  HISTORY  Chief Complaint  Otalgia  HPI Matthew Montes is a 55 y.o. male since to the ED for evaluation of sudden decreased hearing on the right ear as well as some acute ear pain.He describes onset last night while using an over-the-counter ear wash kit for earwax removal. She noted some discomfort to the ear canal and some sudden hearing loss even after he reports a large amount of cerumen flush out of his right ear. He's also had some intermittent dizziness since last night. He denies any fevers, chills, sweats. He denies any otorrhea nausea, or vomiting.  History reviewed. No pertinent past medical history.  There are no active problems to display for this patient.  History reviewed. No pertinent surgical history.  Allergies Review of patient's allergies indicates no known allergies.  No family history on file.  Social History Social History  Substance Use Topics  . Smoking status: Not on file  . Smokeless tobacco: Not on file  . Alcohol use Not on file    Review of Systems  Constitutional: Negative for fever. Eyes: Negative for visual changes. ENT: Negative for sore throat.Right ear pain as above. Gastrointestinal: Negative for abdominal pain, vomiting and diarrhea. Neurological: Negative for headaches, focal weakness or numbness. ____________________________________________  PHYSICAL EXAM:  VITAL SIGNS: ED Triage Vitals  Enc Vitals Group     BP 09/08/15 1918 (!) 170/90     Pulse Rate 09/08/15 1918 95     Resp 09/08/15 1918 18     Temp 09/08/15 1918 98.3 F (36.8 C)     Temp Source 09/08/15 1918 Oral     SpO2 09/08/15 1918 100 %     Weight 09/08/15 1918 238 lb (108 kg)     Height 09/08/15 1918 '5\' 11"'  (1.803 m)     Head Circumference --      Peak Flow --    Pain Score 09/08/15 1919 9     Pain Loc --      Pain Edu? --      Excl. in [place]? --     Constitutional: Alert and oriented. Well appearing and in no distress. Head: Normocephalic and atraumatic.      Eyes: Conjunctivae are normal. PERRL. Normal extraocular movements      Ears: Canals clear, Except the right ear which shows soft, brown cerumen deep in the canal appearing to abut the eardrum. TM intact on the left. Respiratory: Normal respiratory effort.  Neurologic:  Normal gait without ataxia. Normal speech and language. No gross focal neurologic deficits are appreciated. Skin:  Skin is warm, dry and intact. No rash noted. Left antecubital ecchymosis from recent diagnostic venipuncture. Psychiatric: Mood and affect are normal. Patient exhibits appropriate insight and judgment. ____________________________________________  PROCEDURES  CERUMEN REMOVAL Performed by: Melvenia Needles Authorized by: Melvenia Needles Consent: Verbal consent obtained. Risks and benefits: risks, benefits and alternatives were discussed Consent given by: parent/patient Patient identity confirmed: provided demographic data Prepped and Draped in normal fashion  Ear(s) Involved: Right  Irrigation Method: 18G IV cannula + 20 cc syringe  Irrigation Solution: 1:1 mixture of Hydrogen peroxide:water  Resolution: Successful cerumen impaction removal. TM(s) visualized and intact.  Patient tolerance: Patient tolerated the procedure well with no immediate complications. ____________________________________________  INITIAL IMPRESSION / ASSESSMENT AND PLAN / ED COURSE  Patient with a acute right  otalgia secondary to cerumen impaction. Patient with complete resolution of subjective complaint appearing loss and ear pain following successfully wash procedure. Patient is discharged with instructions to refrain from ear wash kit use. He will follow-up with his primary care provider for ongoing symptom  management.  Clinical Course   ____________________________________________  FINAL CLINICAL IMPRESSION(S) / ED DIAGNOSES  Final diagnoses:  Otalgia, right  Cerumen impaction, right     Melvenia Needles, PA-C 09/08/15 2039    Hinda Kehr, MD 09/09/15 0002

## 2015-09-08 NOTE — ED Triage Notes (Signed)
Pt was cleaning out right ear with wax removal and now has right ear pain and has decreased hearing to that ear.

## 2015-10-10 ENCOUNTER — Emergency Department: Payer: Medicare HMO

## 2015-10-10 ENCOUNTER — Observation Stay
Admission: EM | Admit: 2015-10-10 | Discharge: 2015-10-11 | Disposition: A | Discharge status: Home / Self Care | Payer: Medicare HMO | Attending: Specialist | Admitting: Specialist

## 2015-10-10 ENCOUNTER — Encounter: Payer: Self-pay | Admitting: Emergency Medicine

## 2015-10-10 DIAGNOSIS — K029 Dental caries, unspecified: Secondary | ICD-10-CM | POA: Insufficient documentation

## 2015-10-10 DIAGNOSIS — F17220 Nicotine dependence, chewing tobacco, uncomplicated: Secondary | ICD-10-CM | POA: Insufficient documentation

## 2015-10-10 DIAGNOSIS — R220 Localized swelling, mass and lump, head: Secondary | ICD-10-CM | POA: Diagnosis present

## 2015-10-10 DIAGNOSIS — Z7982 Long term (current) use of aspirin: Secondary | ICD-10-CM | POA: Insufficient documentation

## 2015-10-10 DIAGNOSIS — N400 Enlarged prostate without lower urinary tract symptoms: Secondary | ICD-10-CM | POA: Diagnosis not present

## 2015-10-10 DIAGNOSIS — Z9889 Other specified postprocedural states: Secondary | ICD-10-CM | POA: Insufficient documentation

## 2015-10-10 DIAGNOSIS — E119 Type 2 diabetes mellitus without complications: Secondary | ICD-10-CM

## 2015-10-10 DIAGNOSIS — Z8249 Family history of ischemic heart disease and other diseases of the circulatory system: Secondary | ICD-10-CM | POA: Diagnosis not present

## 2015-10-10 DIAGNOSIS — L03221 Cellulitis of neck: Secondary | ICD-10-CM | POA: Diagnosis present

## 2015-10-10 DIAGNOSIS — K122 Cellulitis and abscess of mouth: Principal | ICD-10-CM | POA: Diagnosis present

## 2015-10-10 DIAGNOSIS — Z8719 Personal history of other diseases of the digestive system: Secondary | ICD-10-CM | POA: Diagnosis not present

## 2015-10-10 DIAGNOSIS — J358 Other chronic diseases of tonsils and adenoids: Secondary | ICD-10-CM | POA: Diagnosis not present

## 2015-10-10 DIAGNOSIS — J352 Hypertrophy of adenoids: Secondary | ICD-10-CM | POA: Insufficient documentation

## 2015-10-10 DIAGNOSIS — Z794 Long term (current) use of insulin: Secondary | ICD-10-CM | POA: Insufficient documentation

## 2015-10-10 DIAGNOSIS — E1140 Type 2 diabetes mellitus with diabetic neuropathy, unspecified: Secondary | ICD-10-CM | POA: Insufficient documentation

## 2015-10-10 DIAGNOSIS — Z833 Family history of diabetes mellitus: Secondary | ICD-10-CM | POA: Diagnosis not present

## 2015-10-10 DIAGNOSIS — F329 Major depressive disorder, single episode, unspecified: Secondary | ICD-10-CM | POA: Insufficient documentation

## 2015-10-10 DIAGNOSIS — E11649 Type 2 diabetes mellitus with hypoglycemia without coma: Secondary | ICD-10-CM | POA: Insufficient documentation

## 2015-10-10 DIAGNOSIS — Z981 Arthrodesis status: Secondary | ICD-10-CM | POA: Insufficient documentation

## 2015-10-10 DIAGNOSIS — F209 Schizophrenia, unspecified: Secondary | ICD-10-CM | POA: Diagnosis not present

## 2015-10-10 DIAGNOSIS — J449 Chronic obstructive pulmonary disease, unspecified: Secondary | ICD-10-CM | POA: Insufficient documentation

## 2015-10-10 DIAGNOSIS — R221 Localized swelling, mass and lump, neck: Secondary | ICD-10-CM | POA: Diagnosis present

## 2015-10-10 DIAGNOSIS — G8929 Other chronic pain: Secondary | ICD-10-CM | POA: Insufficient documentation

## 2015-10-10 DIAGNOSIS — M542 Cervicalgia: Secondary | ICD-10-CM | POA: Diagnosis not present

## 2015-10-10 DIAGNOSIS — F909 Attention-deficit hyperactivity disorder, unspecified type: Secondary | ICD-10-CM | POA: Insufficient documentation

## 2015-10-10 DIAGNOSIS — I10 Essential (primary) hypertension: Secondary | ICD-10-CM | POA: Insufficient documentation

## 2015-10-10 HISTORY — DX: Schizophrenia, unspecified: F20.9

## 2015-10-10 HISTORY — DX: Attention-deficit hyperactivity disorder, unspecified type: F90.9

## 2015-10-10 HISTORY — DX: Type 2 diabetes mellitus without complications: E11.9

## 2015-10-10 HISTORY — DX: Cardiomegaly: I51.7

## 2015-10-10 HISTORY — DX: Essential (primary) hypertension: I10

## 2015-10-10 HISTORY — DX: Other chronic pain: G89.29

## 2015-10-10 HISTORY — DX: Cervicalgia: M54.2

## 2015-10-10 HISTORY — DX: Chronic obstructive pulmonary disease, unspecified: J44.9

## 2015-10-10 HISTORY — DX: Hepatic failure, unspecified without coma: K72.90

## 2015-10-10 HISTORY — DX: Dorsalgia, unspecified: M54.9

## 2015-10-10 HISTORY — DX: Unspecified asthma, uncomplicated: J45.909

## 2015-10-10 LAB — COMPREHENSIVE METABOLIC PANEL
ALT: 11 U/L — ABNORMAL LOW (ref 17–63)
AST: 20 U/L (ref 15–41)
Albumin: 3.7 g/dL (ref 3.5–5.0)
Alkaline Phosphatase: 55 U/L (ref 38–126)
Anion gap: 9 (ref 5–15)
BUN: 12 mg/dL (ref 6–20)
CALCIUM: 8.7 mg/dL — AB (ref 8.9–10.3)
CHLORIDE: 103 mmol/L (ref 101–111)
CO2: 24 mmol/L (ref 22–32)
CREATININE: 0.94 mg/dL (ref 0.61–1.24)
GFR calc non Af Amer: >60 mL/min (ref >60)
Glucose, Bld: 216 mg/dL — ABNORMAL HIGH (ref 65–99)
Potassium: 3.6 mmol/L (ref 3.5–5.1)
SODIUM: 136 mmol/L (ref 135–145)
Total Bilirubin: 0.3 mg/dL (ref 0.3–1.2)
Total Protein: 6.7 g/dL (ref 6.5–8.1)

## 2015-10-10 LAB — CBC WITH DIFFERENTIAL/PLATELET
Basophils Absolute: 0.1 10*3/uL (ref 0–0.1)
Basophils Relative: 1 %
EOS ABS: 0.5 10*3/uL (ref 0–0.7)
EOS PCT: 4 %
HCT: 37.4 % — ABNORMAL LOW (ref 40.0–52.0)
Hemoglobin: 12.7 g/dL — ABNORMAL LOW (ref 13.0–18.0)
LYMPHS ABS: 1.8 10*3/uL (ref 1.0–3.6)
Lymphocytes Relative: 14 %
MCH: 28.9 pg (ref 26.0–34.0)
MCHC: 33.9 g/dL (ref 32.0–36.0)
MCV: 85.2 fL (ref 80.0–100.0)
MONOS PCT: 7 %
Monocytes Absolute: 0.8 10*3/uL (ref 0.2–1.0)
Neutro Abs: 9.7 10*3/uL — ABNORMAL HIGH (ref 1.4–6.5)
Neutrophils Relative %: 74 %
PLATELETS: 238 10*3/uL (ref 150–440)
RBC: 4.39 MIL/uL — ABNORMAL LOW (ref 4.40–5.90)
RDW: 14.8 % — AB (ref 11.5–14.5)
WBC: 12.9 10*3/uL — AB (ref 3.8–10.6)

## 2015-10-10 LAB — GLUCOSE, CAPILLARY
GLUCOSE-CAPILLARY: 273 mg/dL — AB (ref 65–99)
Glucose-Capillary: 158 mg/dL — ABNORMAL HIGH (ref 65–99)
Glucose-Capillary: 170 mg/dL — ABNORMAL HIGH (ref 65–99)
Glucose-Capillary: 327 mg/dL — ABNORMAL HIGH (ref 65–99)

## 2015-10-10 LAB — TROPONIN I

## 2015-10-10 LAB — ETHANOL: Alcohol, Ethyl (B): <5 mg/dL (ref <5)

## 2015-10-10 MED ORDER — DICLOFENAC SODIUM 1 % TD GEL
2.0000 g | Freq: Four times a day (QID) | TRANSDERMAL | Status: DC
  Administered 2015-10-10 (×2): 2 g via TOPICAL
  Filled 2015-10-10: qty 100

## 2015-10-10 MED ORDER — TRAMADOL HCL 50 MG PO TABS: 50.0000 mg | ORAL_TABLET | Freq: Four times a day (QID) | ORAL | Status: DC | PRN

## 2015-10-10 MED ORDER — ACETAMINOPHEN 650 MG RE SUPP: 650.0000 mg | Freq: Four times a day (QID) | RECTAL | Status: DC | PRN

## 2015-10-10 MED ORDER — ACETAMINOPHEN 325 MG PO TABS: 650.0000 mg | ORAL_TABLET | Freq: Four times a day (QID) | ORAL | Status: DC | PRN

## 2015-10-10 MED ORDER — TRIAMCINOLONE ACETONIDE 0.1 % EX OINT
1.0000 "application " | TOPICAL_OINTMENT | Freq: Two times a day (BID) | CUTANEOUS | Status: DC
  Administered 2015-10-10 (×2): 1 via TOPICAL
  Filled 2015-10-10: qty 15

## 2015-10-10 MED ORDER — ALBUTEROL SULFATE HFA 108 (90 BASE) MCG/ACT IN AERS: 2.0000 | INHALATION_SPRAY | RESPIRATORY_TRACT | Status: DC | PRN

## 2015-10-10 MED ORDER — LISINOPRIL 5 MG PO TABS
5.0000 mg | ORAL_TABLET | ORAL | Status: DC
  Administered 2015-10-10: 5 mg via ORAL
  Filled 2015-10-10: qty 1

## 2015-10-10 MED ORDER — DEXAMETHASONE SODIUM PHOSPHATE 10 MG/ML IJ SOLN
10.0000 mg | Freq: Two times a day (BID) | INTRAMUSCULAR | Status: DC
  Administered 2015-10-10 – 2015-10-11 (×2): 10 mg via INTRAVENOUS
  Filled 2015-10-10 (×3): qty 1

## 2015-10-10 MED ORDER — IPRATROPIUM-ALBUTEROL 0.5-2.5 (3) MG/3ML IN SOLN
3.0000 mL | Freq: Once | RESPIRATORY_TRACT | Status: AC
  Administered 2015-10-10: 3 mL via RESPIRATORY_TRACT
  Filled 2015-10-10: qty 3

## 2015-10-10 MED ORDER — GLIPIZIDE 5 MG PO TABS
5.0000 mg | ORAL_TABLET | Freq: Two times a day (BID) | ORAL | Status: DC
  Administered 2015-10-10 – 2015-10-11 (×3): 5 mg via ORAL
  Filled 2015-10-10 (×3): qty 1

## 2015-10-10 MED ORDER — PREGABALIN 75 MG PO CAPS
150.0000 mg | ORAL_CAPSULE | Freq: Two times a day (BID) | ORAL | Status: DC
  Administered 2015-10-10 – 2015-10-11 (×3): 150 mg via ORAL
  Filled 2015-10-10 (×3): qty 2

## 2015-10-10 MED ORDER — METOPROLOL SUCCINATE ER 50 MG PO TB24
50.0000 mg | ORAL_TABLET | ORAL | Status: DC
  Administered 2015-10-10: 50 mg via ORAL
  Filled 2015-10-10: qty 1

## 2015-10-10 MED ORDER — ASPIRIN EC 81 MG PO TBEC
81.0000 mg | DELAYED_RELEASE_TABLET | ORAL | Status: DC
  Administered 2015-10-10 – 2015-10-11 (×2): 81 mg via ORAL
  Filled 2015-10-10 (×2): qty 1

## 2015-10-10 MED ORDER — INSULIN GLARGINE 100 UNIT/ML ~~LOC~~ SOLN
60.0000 [IU] | Freq: Every day | SUBCUTANEOUS | Status: DC
  Administered 2015-10-10: 60 [IU] via SUBCUTANEOUS
  Filled 2015-10-10 (×2): qty 0.6

## 2015-10-10 MED ORDER — MORPHINE SULFATE (PF) 4 MG/ML IV SOLN
4.0000 mg | INTRAVENOUS | Status: DC | PRN
  Administered 2015-10-10 (×4): 4 mg via INTRAVENOUS
  Filled 2015-10-10 (×4): qty 1

## 2015-10-10 MED ORDER — PANTOPRAZOLE SODIUM 40 MG PO TBEC
80.0000 mg | DELAYED_RELEASE_TABLET | Freq: Every day | ORAL | Status: DC
Start: 2015-10-10 — End: 2015-10-11
  Administered 2015-10-10: 80 mg via ORAL
  Filled 2015-10-10: qty 2

## 2015-10-10 MED ORDER — DULOXETINE HCL 60 MG PO CPEP
60.0000 mg | ORAL_CAPSULE | Freq: Two times a day (BID) | ORAL | Status: DC
  Administered 2015-10-10 – 2015-10-11 (×3): 60 mg via ORAL
  Filled 2015-10-10 (×4): qty 1

## 2015-10-10 MED ORDER — IOPAMIDOL (ISOVUE-370) INJECTION 76%: 75.0000 mL | Freq: Once | INTRAVENOUS | Status: DC | PRN

## 2015-10-10 MED ORDER — MOMETASONE FURO-FORMOTEROL FUM 200-5 MCG/ACT IN AERO
2.0000 | INHALATION_SPRAY | Freq: Two times a day (BID) | RESPIRATORY_TRACT | Status: DC
  Administered 2015-10-10 – 2015-10-11 (×2): 2 via RESPIRATORY_TRACT
  Filled 2015-10-10: qty 8.8

## 2015-10-10 MED ORDER — DEXAMETHASONE SODIUM PHOSPHATE 10 MG/ML IJ SOLN
INTRAMUSCULAR | Status: AC
  Filled 2015-10-10: qty 1

## 2015-10-10 MED ORDER — DIAZEPAM 5 MG PO TABS: 5.0000 mg | ORAL_TABLET | Freq: Three times a day (TID) | ORAL | Status: DC | PRN

## 2015-10-10 MED ORDER — ONDANSETRON HCL 4 MG PO TABS: 4.0000 mg | ORAL_TABLET | Freq: Four times a day (QID) | ORAL | Status: DC | PRN

## 2015-10-10 MED ORDER — ENOXAPARIN SODIUM 40 MG/0.4ML ~~LOC~~ SOLN
40.0000 mg | SUBCUTANEOUS | Status: DC
  Administered 2015-10-10: 40 mg via SUBCUTANEOUS
  Filled 2015-10-10: qty 0.4

## 2015-10-10 MED ORDER — DOXEPIN HCL 50 MG PO CAPS
50.0000 mg | ORAL_CAPSULE | Freq: Every day | ORAL | Status: DC
  Administered 2015-10-10: 50 mg via ORAL
  Filled 2015-10-10 (×2): qty 1

## 2015-10-10 MED ORDER — IOPAMIDOL (ISOVUE-300) INJECTION 61%
75.0000 mL | Freq: Once | INTRAVENOUS | Status: AC | PRN
Start: 2015-10-10 — End: 2015-10-10
  Administered 2015-10-10: 75 mL via INTRAVENOUS

## 2015-10-10 MED ORDER — ONDANSETRON HCL 4 MG/2ML IJ SOLN
4.0000 mg | Freq: Once | INTRAMUSCULAR | Status: AC
  Administered 2015-10-10: 4 mg via INTRAVENOUS
  Filled 2015-10-10: qty 2

## 2015-10-10 MED ORDER — OLANZAPINE 5 MG PO TABS
5.0000 mg | ORAL_TABLET | Freq: Every day | ORAL | Status: DC
  Administered 2015-10-10: 5 mg via ORAL
  Filled 2015-10-10: qty 1

## 2015-10-10 MED ORDER — METFORMIN HCL 500 MG PO TABS
1000.0000 mg | ORAL_TABLET | Freq: Two times a day (BID) | ORAL | Status: DC
  Administered 2015-10-10 – 2015-10-11 (×2): 1000 mg via ORAL
  Filled 2015-10-10 (×2): qty 2

## 2015-10-10 MED ORDER — SODIUM CHLORIDE 0.9 % IV SOLN
3.0000 g | Freq: Four times a day (QID) | INTRAVENOUS | Status: DC
  Administered 2015-10-10 – 2015-10-11 (×4): 3 g via INTRAVENOUS
  Filled 2015-10-10 (×7): qty 3

## 2015-10-10 MED ORDER — DEXAMETHASONE SODIUM PHOSPHATE 10 MG/ML IJ SOLN
10.0000 mg | Freq: Once | INTRAMUSCULAR | Status: AC
  Administered 2015-10-10: 10 mg via INTRAVENOUS
  Filled 2015-10-10: qty 1

## 2015-10-10 MED ORDER — ATORVASTATIN CALCIUM 20 MG PO TABS
80.0000 mg | ORAL_TABLET | ORAL | Status: DC
  Administered 2015-10-10 – 2015-10-11 (×2): 80 mg via ORAL
  Filled 2015-10-10 (×2): qty 4

## 2015-10-10 MED ORDER — ALBUTEROL SULFATE (2.5 MG/3ML) 0.083% IN NEBU: 2.5000 mg | INHALATION_SOLUTION | RESPIRATORY_TRACT | Status: DC | PRN

## 2015-10-10 MED ORDER — TAMSULOSIN HCL 0.4 MG PO CAPS
0.4000 mg | ORAL_CAPSULE | ORAL | Status: DC
  Administered 2015-10-10 – 2015-10-11 (×2): 0.4 mg via ORAL
  Filled 2015-10-10 (×2): qty 1

## 2015-10-10 MED ORDER — ONDANSETRON HCL 4 MG/2ML IJ SOLN: 4.0000 mg | Freq: Four times a day (QID) | INTRAMUSCULAR | Status: DC | PRN

## 2015-10-10 MED ORDER — CLINDAMYCIN PHOSPHATE 600 MG/50ML IV SOLN
600.0000 mg | Freq: Once | INTRAVENOUS | Status: AC
  Administered 2015-10-10: 600 mg via INTRAVENOUS
  Filled 2015-10-10: qty 50

## 2015-10-10 NOTE — ED Triage Notes (Addendum)
Patient presents to the ED with multiple complaints of this morning. Patient reports that he woke from his sleep with the sensation of laryngeal fullness. Patient reports that he feels like his tongue is swelling and he is SOB. Patient appreciated "bumps" to his BLE - states, "I scratched them and they bled. I guess something bit me and all this started". Additionally, patient reports HYPOglycemia episode this am; CBG 43 at home; at Oatmeal cream pie prior to driving self to the hospital. Patient schizophrenic; every time that this tries to proceed with triage. Patient reports a new complaint. Patient reports that he has also had some chest pain that he would like to get checked out while he is here.

## 2015-10-10 NOTE — Consult Note (Signed)
Pharmacy Antibiotic Note  Matthew Montes is a 55 y.o. male admitted on 10/10/2015 with cellulitis.  Pharmacy has been consulted for unasyn dosing.  Plan: amp/sul 3g q 6 hrs  Height: 5\' 11"  (180.3 cm) Weight: 245 lb (111.1 kg) IBW/kg (Calculated) : 75.3  Temp (24hrs), Avg:98.6 F (37 C), Min:98.6 F (37 C), Max:98.6 F (37 C)   Recent Labs Lab 10/10/15 0710  WBC 12.9*  CREATININE 0.94    Estimated Creatinine Clearance: 113.9 mL/min (by C-G formula based on SCr of 0.94 mg/dL).    No Known Allergies  Antimicrobials this admission: clindamycin 8/26 >> one dose unasyn 8/26 >>   Dose adjustments this admission:   Microbiology results:   Thank you for allowing pharmacy to be a part of this patient's care.  Olene FlossMelissa D Maccia 10/10/2015 10:50 AM

## 2015-10-10 NOTE — ED Notes (Signed)
Pt states checking blood sugar this morning and getting a reading of 43, blood glucose checked upon assessment with a finding out 158.

## 2015-10-10 NOTE — ED Provider Notes (Addendum)
[facility] Emergency Department Provider Note    First MD Initiated Contact with Patient 10/10/15 [phone]     (approximate)  I have reviewed the triage vital signs and the nursing notes.   HISTORY  Chief Complaint Shortness of Breath; Rash; and Hypoglycemia    HPI Matthew Montes is a 55 y.o. male with history of asthma and COPD as well as diabetes and history of schizophrenia on medications presented with multiple complaints. Primary concern is that several days of cough and feeling that something is stuck in his throat. Patient states that this morning when he was getting out of bed he became acutely short of breath and felt like he could not clear phlegm from his throat. Thought at that time that his tongue was swelling up and became lightheaded and diaphoretic. Patient checked his blood sugar at that time and it was 43. He ate some breakfast and had improvement in symptoms and was then able to drive himself to the ER for further evaluation. He admits to intermittent episodes of chest pain that lasts less than 1 minute for the past week. He denies any fevers. Does admit to loose stools that were non-melanotic and did not have any blood. He currently denies any command hallucinations. States that he has chronically heard noises particularly when going to bed but that is been well managed on medications. He has no current plan to hurt himself. He has had fleeting thoughts of suicide in the past but denies any suicidal ideation currently.   Past Medical History:  Diagnosis Date  . ADHD (attention deficit hyperactivity disorder)   . Asthma   . Cardiomegaly   . Chronic back pain   . Chronic neck pain   . COPD (chronic obstructive pulmonary disease) (HCC)   . Diabetes mellitus without complication (HCC)   . Hypertension   . Liver failure (HCC)   . Schizophrenia (HCC)     There are no active problems to display for this patient.   Past Surgical History:    Procedure Laterality Date  . BACK SURGERY    . NECK SURGERY    . SPINAL FUSION      Prior to Admission medications   Medication Sig Start Date End Date Taking? Authorizing Provider  doxepin (SINEQUAN) 50 MG capsule Take 50 mg by mouth at bedtime. 09/02/15  Yes Historical Provider, MD  DULoxetine (CYMBALTA) 60 MG capsule Take 60 mg by mouth 2 (two) times daily. 08/14/15  Yes Historical Provider, MD  esomeprazole (NEXIUM) 40 MG capsule Take 40 mg by mouth daily before breakfast. 10/07/15  Yes Historical Provider, MD  glipiZIDE (GLUCOTROL) 5 MG tablet Take 5 mg by mouth 2 (two) times daily. 09/16/15  Yes Historical Provider, MD  LANTUS SOLOSTAR 100 UNIT/ML Solostar Pen Inject 60 Units into the skin at bedtime. 10/07/15  Yes Historical Provider, MD  lisinopril (PRINIVIL,ZESTRIL) 5 MG tablet Take 5 mg by mouth every morning.  09/18/15  Yes Historical Provider, MD  metFORMIN (GLUCOPHAGE) 1000 MG tablet Take 1,000 mg by mouth 2 (two) times daily with a meal. 10/07/15  Yes Historical Provider, MD  metoprolol succinate (TOPROL-XL) 50 MG 24 hr tablet Take 50 mg by mouth every morning.  10/07/15  Yes Historical Provider, MD  SYMBICORT 160-4.5 MCG/ACT inhaler Inhale 2 puffs into the lungs 2 (two) times daily. 10/08/15  Yes Historical Provider, MD  tamsulosin (FLOMAX) 0.4 MG CAPS capsule Take 0.4 mg by mouth every morning.  10/07/15  Yes Historical Provider,  MD  traMADol (ULTRAM) 50 MG tablet Take 50 mg by mouth every 8 (eight) hours as needed for pain. 09/10/15  Yes Historical Provider, MD  triamcinolone ointment (KENALOG) 0.1 % Apply 1 application topically 2 (two) times daily. 10/07/15  Yes Historical Provider, MD  VENTOLIN HFA 108 (90 Base) MCG/ACT inhaler Inhale 2 puffs into the lungs every 4 (four) hours as needed for shortness of breath or wheezing. 10/07/15  Yes Historical Provider, MD  VOLTAREN 1 % GEL Apply 2 g topically 4 (four) times daily. 09/10/15  Yes Historical Provider, MD    Allergies Review of  patient's allergies indicates no known allergies.  FMH: no bleeding disorders  Social History Social History  Substance Use Topics  . Smoking status: Never Smoker  . Smokeless tobacco: Current User    Types: Chew  . Alcohol use Yes     Comment: 3 drinks/month.     Review of Systems Patient denies headaches, rhinorrhea, blurry vision, numbness, shortness of breath, chest pain, edema, cough, abdominal pain, nausea, vomiting, diarrhea, dysuria, fevers, rashes or hallucinations unless otherwise stated above in HPI. ____________________________________________   PHYSICAL EXAM:  VITAL SIGNS: Vitals:   10/10/15 0728 10/10/15 0935  BP: 139/77 (!) 142/83  Pulse: 92 88  Resp: 18 18  Temp:      Constitutional: Alert and oriented. Well appearing and in no acute distress. Eyes: Conjunctivae are normal. PERRL. EOMI. Head: Atraumatic. Nose: No congestion/rhinnorhea. Mouth/Throat: Mucous membranes are moist.  Oropharynx non-erythematous. Neck:  Sub lingual fullness, induration and ttp, No stridor. Painless ROM. No cervical spine tenderness to palpation no trismus Hematological/Lymphatic/Immunilogical: No cervical lymphadenopathy. Cardiovascular: Normal rate, regular rhythm. Grossly normal heart sounds.  Good peripheral circulation. Respiratory: Normal respiratory effort.  No retractions.expiratory wheeze bilaterally Gastrointestinal: Soft and nontender. No distention. No abdominal bruits. No CVA tenderness. Genitourinary:  Musculoskeletal: No lower extremity tenderness nor edema.  No joint effusions. Neurologic:  Normal speech and language. No gross focal neurologic deficits are appreciated. No gait instability. Skin:  Multiple excoriations to BLE around what appear to be mosquito bites, no erythema, blisters or purulence Psychiatric: Mood and affect are normal. Speech and behavior are normal.  ____________________________________________   LABS (all labs ordered are listed, but only  abnormal results are displayed)  Results for orders placed or performed during the hospital encounter of 10/10/15 (from the past 24 hour(s))  Glucose, capillary     Status: Abnormal   Collection Time: 10/10/15  6:22 AM  Result Value Ref Range   Glucose-Capillary 158 (H) 65 - 99 mg/dL  CBC with Differential/Platelet     Status: Abnormal   Collection Time: 10/10/15  7:10 AM  Result Value Ref Range   WBC 12.9 (H) 3.8 - 10.6 K/uL   RBC 4.39 (L) 4.40 - 5.90 MIL/uL   Hemoglobin 12.7 (L) 13.0 - 18.0 g/dL   HCT 16.1 (L) 09.6 - 04.5 %   MCV 85.2 80.0 - 100.0 fL   MCH 28.9 26.0 - 34.0 pg   MCHC 33.9 32.0 - 36.0 g/dL   RDW 40.9 (H) 81.1 - 91.4 %   Platelets 238 150 - 440 K/uL   Neutrophils Relative % 74 %   Neutro Abs 9.7 (H) 1.4 - 6.5 K/uL   Lymphocytes Relative 14 %   Lymphs Abs 1.8 1.0 - 3.6 K/uL   Monocytes Relative 7 %   Monocytes Absolute 0.8 0.2 - 1.0 K/uL   Eosinophils Relative 4 %   Eosinophils Absolute 0.5 0 - 0.7 K/uL  Basophils Relative 1 %   Basophils Absolute 0.1 0 - 0.1 K/uL  Comprehensive metabolic panel     Status: Abnormal   Collection Time: 10/10/15  7:10 AM  Result Value Ref Range   Sodium 136 135 - 145 mmol/L   Potassium 3.6 3.5 - 5.1 mmol/L   Chloride 103 101 - 111 mmol/L   CO2 24 22 - 32 mmol/L   Glucose, Bld 216 (H) 65 - 99 mg/dL   BUN 12 6 - 20 mg/dL   Creatinine, Ser 4.540.94 0.61 - 1.24 mg/dL   Calcium 8.7 (L) 8.9 - 10.3 mg/dL   Total Protein 6.7 6.5 - 8.1 g/dL   Albumin 3.7 3.5 - 5.0 g/dL   AST 20 15 - 41 U/L   ALT 11 (L) 17 - 63 U/L   Alkaline Phosphatase 55 38 - 126 U/L   Total Bilirubin 0.3 0.3 - 1.2 mg/dL   GFR calc non Af Amer >60 >60 mL/min   GFR calc Af Amer >60 >60 mL/min   Anion gap 9 5 - 15  Ethanol     Status: None   Collection Time: 10/10/15  7:10 AM  Result Value Ref Range   Alcohol, Ethyl (B) <5 <5 mg/dL  Troponin I     Status: None   Collection Time: 10/10/15  7:10 AM  Result Value Ref Range   Troponin I <0.03 <0.03 ng/mL    ____________________________________________  EKG My review and personal interpretation at Time: 7:15   Indication: tachycardia  Rate: 90  Rhythm: sinus Axis: normal Other: no acute ischemic changes ____________________________________________  RADIOLOGY  CXR with hyperinflation CT neck with evidence of submandibular   ____________________________________________   PROCEDURES  Procedure(s) performed: none    Critical Care performed: no ____________________________________________   INITIAL IMPRESSION / ASSESSMENT AND PLAN / ED COURSE  Pertinent labs & imaging results that were available during my care of the patient were reviewed by me and considered in my medical decision making (see chart for details).  DDX: copd, chf, acs, ludwigs, strep throat, epiglottitis, mass, psychosis  Matthew Montes is a 55 y.o. who presents to the ED with multiple complaints but primarily complaining of feeling that his throat was swelling and shortness of breath this morning. Patient has a history of schizophrenia but denies any suicidal ideation or hallucinations at this time. Based on his exam his uvula is midline without edema. There is no tongue swelling or lip swelling secondary to not feel this is consistent with angioedema as the patient has been off of his medications for 3 weeks. Given his presentation I am however concerned for a submandibular or sublingual infection or abscess. Patient has no trismus at this time..  The patient will be placed on continuous pulse oximetry and telemetry for monitoring.  Laboratory evaluation will be sent to evaluate for the above complaints.     Clinical Course  Comment By Time  Patient has normal glucose and normal renal function. Mild leukocytosis. Currently awaiting CT imaging to further evaluate for Ludwig angina or other soft tissue infection. Patient receiving albuterol nebulizer for probable COPD with bronchitis. Willy EddyPatrick Tauri Ethington, MD 08/26 [phone]   CT imaging does show evidence of edema and probable cellulitis of the submandibular space. No evidence of airway obstruction at this time. Will give patient IV Clinda. Willy EddyPatrick Veona Bittman, MD 08/26 [phone]  Patient reassessed. States that he was on ACE inhibitor lisinopril but has not taken this medication since the first month. States that the swelling has  not gotten any worse. On reassessment patient has no stridor or trismus but is somewhat mentallyis still firm and tender to touch. Based on his findings do feel patient should be admitted for close observation.  Have discussed with the patient and available family all diagnostics and treatments performed thus far and all questions were answered to the best of my ability. The patient demonstrates understanding and agreement with plan.  Willy Eddy, MD 08/26 [phone]     ____________________________________________   FINAL CLINICAL IMPRESSION(S) / ED DIAGNOSES  Final diagnoses:  Submandibular swelling  Cellulitis of neck      NEW MEDICATIONS STARTED DURING THIS VISIT:  New Prescriptions   No medications on file     Note:  This document was prepared using Dragon voice recognition software and may include unintentional dictation errors.    Willy Eddy, MD 10/10/15 1001    Willy Eddy, MD 10/10/15 1016

## 2015-10-10 NOTE — Care Management Obs Status (Signed)
MEDICARE OBSERVATION STATUS NOTIFICATION   Patient Details  Name: Matthew Montes MRN: [id number] Date of Birth: [birth year]   Medicare Observation Status Notification Given:  Yes (sublingular pain)    Caren MacadamMichelle Romelo Sciandra, RN 10/10/2015, 10:21 AM

## 2015-10-10 NOTE — H&P (Signed)
[employer] [facility] - [place] [facility]   PATIENT NAME: Matthew Montes    MR#:  [id number]  DATE OF BIRTH:  1960-05-11  DATE OF ADMISSION:  10/10/2015  PRIMARY CARE PHYSICIAN: Shirl Harris, MD   REQUESTING/REFERRING PHYSICIAN: Dr. Willy Eddy  CHIEF COMPLAINT:   Chief Complaint  Patient presents with  . Shortness of Breath  . Rash  . Hypoglycemia    HISTORY OF PRESENT ILLNESS:  Matthew Montes  is a 55 y.o. male with a known history of Schizophrenia, ADHD, diabetes, hypertension, history of chronic liver disease who presents to the hospital complaining of some throat pain and swelling and tongue swelling that began earlier this morning. Patient says that he attempted to use his CPAP last night and shortly after he put on he started getting a cough, then she developed some throat tightness and felt his tongue was swollen.  He therefore came to the ER for further evaluation. Patient denies any fevers, chills, nausea, vomiting, abdominal pain or any other associated symptoms. He did say that he checked his blood sugar and he was slightly hypoglycemic but ate something and his blood sugars have improved since then. In the emergency room patient underwent a CT scan of his neck and head which showed a submandibular cellulitis. Hospitalist services were contacted further treatment and evaluation.  PAST MEDICAL HISTORY:   Past Medical History:  Diagnosis Date  . ADHD (attention deficit hyperactivity disorder)   . Asthma   . Cardiomegaly   . Chronic back pain   . Chronic neck pain   . COPD (chronic obstructive pulmonary disease) (HCC)   . Diabetes mellitus without complication (HCC)   . Hypertension   . Liver failure (HCC)   . Schizophrenia (HCC)     PAST SURGICAL HISTORY:   Past Surgical History:  Procedure Laterality Date  . BACK SURGERY    . NECK SURGERY    . SPINAL FUSION      SOCIAL HISTORY:   Social History  Substance Use Topics  . Smoking status: Never  Smoker  . Smokeless tobacco: Current User    Types: Chew  . Alcohol use Yes     Comment: 3 drinks/month.     FAMILY HISTORY:   Family History  Problem Relation Age of Onset  . Heart attack Mother   . Heart disease Father   . Diabetes Father     DRUG ALLERGIES:  No Known Allergies  REVIEW OF SYSTEMS:   Review of Systems  Constitutional: Negative for fever and weight loss.  HENT: Positive for sore throat. Negative for congestion, nosebleeds and tinnitus.   Eyes: Negative for blurred vision, double vision and redness.  Respiratory: Negative for cough, hemoptysis and shortness of breath.   Cardiovascular: Negative for chest pain, orthopnea, leg swelling and PND.  Gastrointestinal: Negative for abdominal pain, diarrhea, melena, nausea and vomiting.  Genitourinary: Negative for dysuria, hematuria and urgency.  Musculoskeletal: Negative for falls and joint pain.  Neurological: Negative for dizziness, tingling, sensory change, focal weakness, seizures, weakness and headaches.  Endo/Heme/Allergies: Negative for polydipsia. Does not bruise/bleed easily.  Psychiatric/Behavioral: Negative for depression and memory loss. The patient is not nervous/anxious.     MEDICATIONS AT HOME:   Prior to Admission medications   Medication Sig Start Date End Date Taking? Authorizing Provider  aspirin EC 81 MG tablet Take 81 mg by mouth every morning.   Yes Historical Provider, MD  atorvastatin (LIPITOR) 80 MG tablet Take 80 mg by mouth every morning.  10/08/15 10/07/16 Yes Historical Provider, MD  diazepam (VALIUM) 5 MG tablet Take 5 mg by mouth 3 (three) times daily.   Yes Historical Provider, MD  doxepin (SINEQUAN) 50 MG capsule Take 50 mg by mouth at bedtime. 09/02/15  Yes Historical Provider, MD  DULoxetine (CYMBALTA) 60 MG capsule Take 60 mg by mouth 2 (two) times daily. 08/14/15  Yes Historical Provider, MD  esomeprazole (NEXIUM) 40 MG capsule Take 40 mg by mouth daily before breakfast. 10/07/15   Yes Historical Provider, MD  glipiZIDE (GLUCOTROL) 5 MG tablet Take 5 mg by mouth 2 (two) times daily. 09/16/15  Yes Historical Provider, MD  LANTUS SOLOSTAR 100 UNIT/ML Solostar Pen Inject 60 Units into the skin at bedtime. 10/07/15  Yes Historical Provider, MD  lisinopril (PRINIVIL,ZESTRIL) 5 MG tablet Take 5 mg by mouth every morning.  09/18/15  Yes Historical Provider, MD  LYRICA 150 MG capsule Take 150 mg by mouth 2 (two) times daily. 09/10/15  Yes Historical Provider, MD  metFORMIN (GLUCOPHAGE) 1000 MG tablet Take 1,000 mg by mouth 2 (two) times daily with a meal. 10/07/15  Yes Historical Provider, MD  metoprolol succinate (TOPROL-XL) 50 MG 24 hr tablet Take 50 mg by mouth every morning.  10/07/15  Yes Historical Provider, MD  OLANZapine (ZYPREXA) 5 MG tablet Take 5 mg by mouth at bedtime. 08/14/15  Yes Historical Provider, MD  SYMBICORT 160-4.5 MCG/ACT inhaler Inhale 2 puffs into the lungs 2 (two) times daily. 10/08/15  Yes Historical Provider, MD  tamsulosin (FLOMAX) 0.4 MG CAPS capsule Take 0.4 mg by mouth every morning.  10/07/15  Yes Historical Provider, MD  traMADol (ULTRAM) 50 MG tablet Take 50 mg by mouth every 8 (eight) hours as needed for pain. 09/10/15  Yes Historical Provider, MD  triamcinolone ointment (KENALOG) 0.1 % Apply 1 application topically 2 (two) times daily. 10/07/15  Yes Historical Provider, MD  VENTOLIN HFA 108 (90 Base) MCG/ACT inhaler Inhale 2 puffs into the lungs every 4 (four) hours as needed for shortness of breath or wheezing. 10/07/15  Yes Historical Provider, MD  VOLTAREN 1 % GEL Apply 2 g topically 4 (four) times daily. 09/10/15  Yes Historical Provider, MD      VITAL SIGNS:  Blood pressure (!) 142/83, pulse 88, temperature 98.6 F (37 C), temperature source Oral, resp. rate 18, height 5\' 11"  (1.803 m), weight 111.1 kg (245 lb), SpO2 98 %.  PHYSICAL EXAMINATION:  Physical Exam  GENERAL:  55 y.o.-year-old patient lying in the bed with no acute distress.  EYES: Pupils  equal, round, reactive to light and accommodation. No scleral icterus. Extraocular muscles intact.  HEENT: Head atraumatic, normocephalic. Oropharynx and nasopharynx clear. No oropharyngeal erythema, moist oral mucosa.  + submandibular swelling, tenderness.  No tongue swelling noted. NECK:  Supple, no jugular venous distention. No thyroid enlargement, no tenderness.  LUNGS: Normal breath sounds bilaterally, no wheezing, rales, rhonchi. No use of accessory muscles of respiration.  CARDIOVASCULAR: S1, S2 RRR. No murmurs, rubs, gallops, clicks.  ABDOMEN: Soft, nontender, nondistended. Bowel sounds present. No organomegaly or mass.  EXTREMITIES: No pedal edema, cyanosis, or clubbing. + 2 pedal & radial pulses b/l.   NEUROLOGIC: Cranial nerves II through XII are intact. No focal Motor or sensory deficits appreciated b/l PSYCHIATRIC: The patient is alert and oriented x 3. Good affect.  SKIN: No obvious rash, lesion, or ulcer.   LABORATORY PANEL:   CBC  Recent Labs Lab 10/10/15 0710  WBC 12.9*  HGB 12.7*  HCT 37.4*  PLT 238   ------------------------------------------------------------------------------------------------------------------  Chemistries   Recent Labs Lab 10/10/15 0710  NA 136  K 3.6  CL 103  CO2 24  GLUCOSE 216*  BUN 12  CREATININE 0.94  CALCIUM 8.7*  AST 20  ALT 11*  ALKPHOS 55  BILITOT 0.3   ------------------------------------------------------------------------------------------------------------------  Cardiac Enzymes  Recent Labs Lab 10/10/15 0710  TROPONINI <0.03   ------------------------------------------------------------------------------------------------------------------  RADIOLOGY:  Dg Chest 1 View  Result Date: 10/10/2015 CLINICAL DATA:  Acute shortness of breath and tachycardia. EXAM: CHEST 1 VIEW COMPARISON:  08/27/2011 FINDINGS: Mild hyperinflation of the lungs. Heart is borderline in size. No confluent airspace opacity or effusion.  No acute bony abnormality. IMPRESSION: Mild hyperinflation.  No active disease. Electronically Signed   By: Charlett NoseKevin  Dover M.D.   On: 10/10/2015 07:36   Ct Soft Tissue Neck W Contrast  Result Date: 10/10/2015 CLINICAL DATA:  Sublingual pain and swelling. Rule out mass or Ludwig's angina. EXAM: CT NECK WITH CONTRAST TECHNIQUE: Multidetector CT imaging of the neck was performed using the standard protocol following the bolus administration of intravenous contrast. CONTRAST:  75mL ISOVUE-300 IOPAMIDOL (ISOVUE-300) INJECTION 61% COMPARISON:  None. FINDINGS: Pharynx and larynx: Mild adenoid hypertrophy bilaterally without mass. Oropharynx normal. Tonsillith on the right. No tonsillar abscess. Epiglottis and vocal cords normal. Salivary glands: Mild edema medial and anterior to the right submandibular gland. The right submandibular gland shows normal size and density without mass or stone. Edema in the subcutaneous fat may represent salivary gland infection versus cellulitis. Left submandibular gland normal. Parotid gland normal bilaterally. No mass lesion or abscess in the floor of the mouth. Thyroid: Negative Lymph nodes: Right submandibular node 9 mm with fatty hilum. Left submandibular lymph node 8.6 mm. 7 mm posterior lymph nodes superiorly on the left. No definite pathologic lymph nodes. Vascular: Carotid artery and jugular vein patent bilaterally. Mild atherosclerotic calcification right carotid bifurcation. Limited intracranial: Negative Visualized orbits: Negative Mastoids and visualized paranasal sinuses: Mild mucosal thickening in the sphenoid sinus. Remaining sinuses clear. Skeleton: ACDF with anterior plate and screws C4 through C7. Mild anterior listhesis C3-4 with associated disc and facet degeneration. No fracture. Caries in the lower molars. Periapical lucency around left lower second molar compatible with dental infection. Impacted lower third molar bilaterally. Caries right lower second molar. Upper  chest: Lung apices clear bilaterally. IMPRESSION: Mild edema in medial and anterior to the right submandibular gland. This may be cellulitis or sialoadenitis. No abscess identified. Cervical lymph nodes not pathologic pathologically enlarged most likely reactive. Poor dentition Electronically Signed   By: Marlan Palauharles  Clark M.D.   On: 10/10/2015 09:12     IMPRESSION AND PLAN:   55 year old male with past history of diabetes, hypertension, chronic liver disease, schizophrenia, ADHD who presented to the hospital with throat/tongue swelling and also hypoglycemia.  1. Submandibular cellulitis-this is a clinical diagnosis causing his throat/tongue swelling. -Patient did have a CT scan which showed edema of the right submandibular gland. Unlikely this is Ludwig's angina as it's not bilateral. -I will treat the patient with IV Decadron, IV Unasyn, follow clinically.  2. Leukocytosis-secondary to the cellulitis. Will with IV antibiotic treatment.  3. Diabetes type II without complication-continue Lantus, glipizide, metformin.  4. Diabetic neuropathy-continue Lyrica.  5. Essential hypertension-continue lisinopril, metoprolol.  6. History of schizophrenia-continue Zyprexa.  7. Depression-continue Cymbalta.  8. BPH-continue Flomax.  9. History of COPD-no acute exacerbation. Continue Dulera, albuterol inhaler as needed.  10. GERD-continue Protonix.     All the records are reviewed  and case discussed with ED provider. Management plans discussed with the patient, family and they are in agreement.  CODE STATUS: Full  TOTAL TIME TAKING CARE OF THIS PATIENT: 45 minutes.    Houston Siren M.D on 10/10/2015 at 10:19 AM  Between 7am to 6pm - Pager - [phone]  After 6pm go to [email or web address] - password [email or web address] [facility]  [facility]  Office  [phone]  CC: Primary care physician; Shirl Harris, MD

## 2015-10-11 LAB — CBC
HCT: 34.9 % — ABNORMAL LOW (ref 40.0–52.0)
HEMOGLOBIN: 12 g/dL — AB (ref 13.0–18.0)
MCH: 29.3 pg (ref 26.0–34.0)
MCHC: 34.5 g/dL (ref 32.0–36.0)
MCV: 85.1 fL (ref 80.0–100.0)
PLATELETS: 234 10*3/uL (ref 150–440)
RBC: 4.1 MIL/uL — AB (ref 4.40–5.90)
RDW: 15.2 % — ABNORMAL HIGH (ref 11.5–14.5)
WBC: 10.2 10*3/uL (ref 3.8–10.6)

## 2015-10-11 LAB — BASIC METABOLIC PANEL
ANION GAP: 7 (ref 5–15)
BUN: 14 mg/dL (ref 6–20)
CALCIUM: 8.9 mg/dL (ref 8.9–10.3)
CHLORIDE: 104 mmol/L (ref 101–111)
CO2: 28 mmol/L (ref 22–32)
CREATININE: 0.94 mg/dL (ref 0.61–1.24)
GFR calc non Af Amer: >60 mL/min (ref >60)
Glucose, Bld: 169 mg/dL — ABNORMAL HIGH (ref 65–99)
Potassium: 4.2 mmol/L (ref 3.5–5.1)
SODIUM: 139 mmol/L (ref 135–145)

## 2015-10-11 MED ORDER — PREDNISONE 10 MG PO TABS: ORAL_TABLET | ORAL | 0 refills | Status: DC

## 2015-10-11 MED ORDER — AMOXICILLIN-POT CLAVULANATE 875-125 MG PO TABS: 1.0000 | ORAL_TABLET | Freq: Two times a day (BID) | ORAL | 0 refills | Status: DC

## 2015-10-11 NOTE — Discharge Summary (Signed)
[facility]   PATIENT NAME: Matthew Montes    MR#:  [id number]  DATE OF BIRTH:  1960/07/27  DATE OF ADMISSION:  10/10/2015 ADMITTING PHYSICIAN: Houston SirenVivek J Sainani, MD  DATE OF DISCHARGE: 10/11/2015 12:20 PM  PRIMARY CARE PHYSICIAN: Shirl HarrisEICK, JOHN F, MD    ADMISSION DIAGNOSIS:  Cellulitis of neck [L03.221] Submandibular swelling [R22.0, R22.1]  DISCHARGE DIAGNOSIS:  Active Problems:   Cellulitis of submandibular region   SECONDARY DIAGNOSIS:   Past Medical History:  Diagnosis Date  . ADHD (attention deficit hyperactivity disorder)   . Asthma   . Cardiomegaly   . Chronic back pain   . Chronic neck pain   . COPD (chronic obstructive pulmonary disease) (HCC)   . Diabetes mellitus without complication (HCC)   . Hypertension   . Liver failure (HCC)   . Schizophrenia [facility](HCC)     HOSPITAL COURSE:   55 year old male with past history of diabetes, hypertension, chronic liver disease, schizophrenia, ADHD who presented to the hospital with throat/tongue swelling and also hypoglycemia.  1. Submandibular cellulitis-this was the clinical diagnosis causing his throat/tongue swelling. -Patient did have a CT scan which showed edema of the right submandibular gland. Unlikely this is Ludwig's angina as it's not bilateral. -On the hospital patient was treated with IV Decadron and IV Unasyn. His white cell count has normalized, and his swelling is improved. He denies any dysphagia, and he has had no fevers. -He is currently being discharged on oral Augmentin, prednisone taper.  2. Leukocytosis-secondary to the cellulitis.  - resolved w/ IV abx.    3. Diabetes type II without complication-  Pt. Will continue Lantus, glipizide, metformin.  4. Diabetic neuropathy- he will continue Lyrica.  5. Essential hypertension- he will continue lisinopril, metoprolol.  6. History of schizophrenia- he will continue Zyprexa.  7. Depression-he will continue  Cymbalta.  8. BPH- he will continue Flomax.  9. History of COPD-no acute exacerbation. Continue Dulera, albuterol inhaler as needed.  10. GERD-continue Protonix.  DISCHARGE CONDITIONS:   Stable.   CONSULTS OBTAINED:    DRUG ALLERGIES:  No Known Allergies  DISCHARGE MEDICATIONS:     Medication List    TAKE these medications   amoxicillin-clavulanate 875-125 MG tablet Commonly known as:  AUGMENTIN Take 1 tablet by mouth 2 (two) times daily.   aspirin EC 81 MG tablet Take 81 mg by mouth every morning.   atorvastatin 80 MG tablet Commonly known as:  LIPITOR Take 80 mg by mouth every morning.   diazepam 5 MG tablet Commonly known as:  VALIUM Take 5 mg by mouth 3 (three) times daily.   doxepin 50 MG capsule Commonly known as:  SINEQUAN Take 50 mg by mouth at bedtime.   DULoxetine 60 MG capsule Commonly known as:  CYMBALTA Take 60 mg by mouth 2 (two) times daily.   esomeprazole 40 MG capsule Commonly known as:  NEXIUM Take 40 mg by mouth daily before breakfast.   glipiZIDE 5 MG tablet Commonly known as:  GLUCOTROL Take 5 mg by mouth 2 (two) times daily.   LANTUS SOLOSTAR 100 UNIT/ML Solostar Pen Generic drug:  Insulin Glargine Inject 60 Units into the skin at bedtime.   lisinopril 5 MG tablet Commonly known as:  PRINIVIL,ZESTRIL Take 5 mg by mouth every morning.   LYRICA 150 MG capsule Generic drug:  pregabalin Take 150 mg by mouth 2 (two) times daily.   metFORMIN 1000 MG tablet Commonly known as:  GLUCOPHAGE Take 1,000 mg  by mouth 2 (two) times daily with a meal.   metoprolol succinate 50 MG 24 hr tablet Commonly known as:  TOPROL-XL Take 50 mg by mouth every morning.   OLANZapine 5 MG tablet Commonly known as:  ZYPREXA Take 5 mg by mouth at bedtime.   predniSONE 10 MG tablet Commonly known as:  DELTASONE Label  & dispense according to the schedule below. 5 Pills PO for 1 day then, 4 Pills PO for 1 day, 3 Pills PO for 1 day, 2 Pills PO for  1 day, 1 Pill PO for 1 days then STOP.   SYMBICORT 160-4.5 MCG/ACT inhaler Generic drug:  budesonide-formoterol Inhale 2 puffs into the lungs 2 (two) times daily.   tamsulosin 0.4 MG Caps capsule Commonly known as:  FLOMAX Take 0.4 mg by mouth every morning.   traMADol 50 MG tablet Commonly known as:  ULTRAM Take 50 mg by mouth every 8 (eight) hours as needed for pain.   triamcinolone ointment 0.1 % Commonly known as:  KENALOG Apply 1 application topically 2 (two) times daily.   VENTOLIN HFA 108 (90 Base) MCG/ACT inhaler Generic drug:  albuterol Inhale 2 puffs into the lungs every 4 (four) hours as needed for shortness of breath or wheezing.   VOLTAREN 1 % Gel Generic drug:  diclofenac sodium Apply 2 g topically 4 (four) times daily.         DISCHARGE INSTRUCTIONS:   DIET:  Cardiac diet and Diabetic diet  DISCHARGE CONDITION:  Stable  ACTIVITY:  Activity as tolerated  OXYGEN:  Home Oxygen: No.   Oxygen Delivery: room air  DISCHARGE LOCATION:  home   If you experience worsening of your admission symptoms, develop shortness of breath, life threatening emergency, suicidal or homicidal thoughts you must seek medical attention immediately by calling 911 or calling your MD immediately  if symptoms less severe.  You Must read complete instructions/literature along with all the possible adverse reactions/side effects for all the Medicines you take and that have been prescribed to you. Take any new Medicines after you have completely understood and accpet all the possible adverse reactions/side effects.   Please note  You were cared for by a hospitalist during your hospital stay. If you have any questions about your discharge medications or the care you received while you were in the hospital after you are discharged, you can call the unit and asked to speak with the hospitalist on call if the hospitalist that took care of you is not available. Once you are discharged,  your primary care physician will handle any further medical issues. Please note that NO REFILLS for any discharge medications will be authorized once you are discharged, as it is imperative that you return to your primary care physician (or establish a relationship with a primary care physician if you do not have one) for your aftercare needs so that they can reassess your need for medications and monitor your lab values.     Today   Afebrile, no dysphagia or tongue swelling. Swallowing well.  D/c home today.   VITAL SIGNS:  Blood pressure (!) 99/53, pulse 62, temperature 97.7 F (36.5 C), temperature source Oral, resp. rate 16, height 5\' 11"  (1.803 m), weight 111.1 kg (245 lb), SpO2 97 %.  I/O:   Intake/Output Summary (Last 24 hours) at 10/11/15 1424 Last data filed at 10/11/15 1200  Gross per 24 hour  Intake              780 ml  Output             1200 ml  Net             -420 ml    PHYSICAL EXAMINATION:   GENERAL:  55 y.o.-year-old patient lying in the bed with no acute distress.  EYES: Pupils equal, round, reactive to light and accommodation. No scleral icterus. Extraocular muscles intact.  HEENT: Head atraumatic, normocephalic. Oropharynx and nasopharynx clear. No oropharyngeal erythema, moist oral mucosa.  + submandibular swelling much improved since yesterday, No tenderness.  No tongue swelling noted. NECK:  Supple, no jugular venous distention. No thyroid enlargement, no tenderness.  LUNGS: Normal breath sounds bilaterally, no wheezing, rales, rhonchi. No use of accessory muscles of respiration.  CARDIOVASCULAR: S1, S2 RRR. No murmurs, rubs, gallops, clicks.  ABDOMEN: Soft, nontender, nondistended. Bowel sounds present. No organomegaly or mass.  EXTREMITIES: No pedal edema, cyanosis, or clubbing. + 2 pedal & radial pulses b/l.   NEUROLOGIC: Cranial nerves II through XII are intact. No focal Motor or sensory deficits appreciated b/l PSYCHIATRIC: The patient is alert and  oriented x 3. Good affect.  SKIN: No obvious rash, lesion, or ulcer.   DATA REVIEW:   CBC  Recent Labs Lab 10/11/15 0614  WBC 10.2  HGB 12.0*  HCT 34.9*  PLT 234    Chemistries   Recent Labs Lab 10/10/15 0710 10/11/15 0614  NA 136 139  K 3.6 4.2  CL 103 104  CO2 24 28  GLUCOSE 216* 169*  BUN 12 14  CREATININE 0.94 0.94  CALCIUM 8.7* 8.9  AST 20  --   ALT 11*  --   ALKPHOS 55  --   BILITOT 0.3  --     Cardiac Enzymes  Recent Labs Lab 10/10/15 0710  TROPONINI <0.03    Microbiology Results  Results for orders placed or performed during the hospital encounter of 07/09/10  Urine culture     Status: None   Collection Time: 07/12/10  2:05 AM  Result Value Ref Range Status   Specimen Description URINE, CATHETERIZED  Final   Special Requests IMMUNE:NORM UT SYMPT:NEG  Final   Culture  Setup Time [id number]  Final   Colony Count NO GROWTH  Final   Culture NO GROWTH  Final   Report Status 07/13/2010 FINAL  Final  Culture, blood (routine x 2)     Status: None   Collection Time: 07/12/10  3:28 AM  Result Value Ref Range Status   Specimen Description BLOOD LEFT ARM  Final   Special Requests BOTTLES DRAWN AEROBIC AND ANAEROBIC 10CC EA  Final   Culture  Setup Time [id number]  Final   Culture NO GROWTH 5 DAYS  Final   Report Status 07/18/2010 FINAL  Final  Culture, blood (routine x 2)     Status: None   Collection Time: 07/12/10  3:37 AM  Result Value Ref Range Status   Specimen Description BLOOD LEFT ARM  Final   Special Requests BOTTLES DRAWN AEROBIC AND ANAEROBIC 10CC EA  Final   Culture  Setup Time [id number]  Final   Culture NO GROWTH 5 DAYS  Final   Report Status 07/18/2010 FINAL  Final    RADIOLOGY:  Dg Chest 1 View  Result Date: 10/10/2015 CLINICAL DATA:  Acute shortness of breath and tachycardia. EXAM: CHEST 1 VIEW COMPARISON:  08/27/2011 FINDINGS: Mild hyperinflation of the lungs. Heart is borderline in size. No confluent airspace opacity or  effusion. No acute bony abnormality. IMPRESSION: Mild  hyperinflation.  No active disease. Electronically Signed   By: Charlett Nose M.D.   On: 10/10/2015 07:36   Ct Soft Tissue Neck W Contrast  Result Date: 10/10/2015 CLINICAL DATA:  Sublingual pain and swelling. Rule out mass or Ludwig's angina. EXAM: CT NECK WITH CONTRAST TECHNIQUE: Multidetector CT imaging of the neck was performed using the standard protocol following the bolus administration of intravenous contrast. CONTRAST:  75mL ISOVUE-300 IOPAMIDOL (ISOVUE-300) INJECTION 61% COMPARISON:  None. FINDINGS: Pharynx and larynx: Mild adenoid hypertrophy bilaterally without mass. Oropharynx normal. Tonsillith on the right. No tonsillar abscess. Epiglottis and vocal cords normal. Salivary glands: Mild edema medial and anterior to the right submandibular gland. The right submandibular gland shows normal size and density without mass or stone. Edema in the subcutaneous fat may represent salivary gland infection versus cellulitis. Left submandibular gland normal. Parotid gland normal bilaterally. No mass lesion or abscess in the floor of the mouth. Thyroid: Negative Lymph nodes: Right submandibular node 9 mm with fatty hilum. Left submandibular lymph node 8.6 mm. 7 mm posterior lymph nodes superiorly on the left. No definite pathologic lymph nodes. Vascular: Carotid artery and jugular vein patent bilaterally. Mild atherosclerotic calcification right carotid bifurcation. Limited intracranial: Negative Visualized orbits: Negative Mastoids and visualized paranasal sinuses: Mild mucosal thickening in the sphenoid sinus. Remaining sinuses clear. Skeleton: ACDF with anterior plate and screws C4 through C7. Mild anterior listhesis C3-4 with associated disc and facet degeneration. No fracture. Caries in the lower molars. Periapical lucency around left lower second molar compatible with dental infection. Impacted lower third molar bilaterally. Caries right lower second  molar. Upper chest: Lung apices clear bilaterally. IMPRESSION: Mild edema in medial and anterior to the right submandibular gland. This may be cellulitis or sialoadenitis. No abscess identified. Cervical lymph nodes not pathologic pathologically enlarged most likely reactive. Poor dentition Electronically Signed   By: Marlan Palau M.D.   On: 10/10/2015 09:12      Management plans discussed with the patient, family and they are in agreement.  CODE STATUS:     Code Status Orders        Start     Ordered   10/10/15 1122  Full code  Continuous     10/10/15 1121    Code Status History    Date Active Date Inactive Code Status Order ID Comments User Context   This patient has a current code status but no historical code status.      TOTAL TIME TAKING CARE OF THIS PATIENT: 40 minutes.    Houston Siren M.D on 10/11/2015 at 2:24 PM  Between 7am to 6pm - Pager - [phone]  After 6pm go to [email or web address] - Social research officer, government  [facility]  Office  [phone]  CC: Primary care physician; Shirl Harris, MD

## 2015-10-11 NOTE — Discharge Instructions (Signed)
Take medications as prescribed.  Please notify your doctor if you should experience nausea, vomiting, fever, difficulty swallowing and if you should have any questions or concerns.  Please report to your nearest emergency room or call 911 if you should experience swelling of the throat or tongue.

## 2015-10-11 NOTE — Progress Notes (Signed)
Pt d/c home; d/c instructions reviewed w/ pt; pt understanding was verbalized; IV removed catheter in tact, gauze dressing applied; all pt questions answered; pt left unit via wheelchair accompanied by staff 

## 2015-11-01 ENCOUNTER — Emergency Department: Payer: Medicare HMO

## 2015-11-01 ENCOUNTER — Emergency Department
Admission: EM | Admit: 2015-11-01 | Discharge: 2015-11-01 | Disposition: A | Discharge status: Home / Self Care | Payer: Medicare HMO | Attending: Emergency Medicine | Admitting: Emergency Medicine

## 2015-11-01 ENCOUNTER — Encounter: Payer: Self-pay | Admitting: Emergency Medicine

## 2015-11-01 DIAGNOSIS — E119 Type 2 diabetes mellitus without complications: Secondary | ICD-10-CM | POA: Diagnosis not present

## 2015-11-01 DIAGNOSIS — F17220 Nicotine dependence, chewing tobacco, uncomplicated: Secondary | ICD-10-CM | POA: Diagnosis not present

## 2015-11-01 DIAGNOSIS — Z7984 Long term (current) use of oral hypoglycemic drugs: Secondary | ICD-10-CM | POA: Diagnosis not present

## 2015-11-01 DIAGNOSIS — R0602 Shortness of breath: Secondary | ICD-10-CM

## 2015-11-01 DIAGNOSIS — Z791 Long term (current) use of non-steroidal anti-inflammatories (NSAID): Secondary | ICD-10-CM | POA: Insufficient documentation

## 2015-11-01 DIAGNOSIS — J40 Bronchitis, not specified as acute or chronic: Secondary | ICD-10-CM

## 2015-11-01 DIAGNOSIS — F909 Attention-deficit hyperactivity disorder, unspecified type: Secondary | ICD-10-CM | POA: Insufficient documentation

## 2015-11-01 DIAGNOSIS — R05 Cough: Secondary | ICD-10-CM | POA: Diagnosis present

## 2015-11-01 DIAGNOSIS — Z79899 Other long term (current) drug therapy: Secondary | ICD-10-CM | POA: Diagnosis not present

## 2015-11-01 DIAGNOSIS — I10 Essential (primary) hypertension: Secondary | ICD-10-CM | POA: Insufficient documentation

## 2015-11-01 DIAGNOSIS — J069 Acute upper respiratory infection, unspecified: Secondary | ICD-10-CM | POA: Diagnosis not present

## 2015-11-01 DIAGNOSIS — Z7982 Long term (current) use of aspirin: Secondary | ICD-10-CM | POA: Insufficient documentation

## 2015-11-01 LAB — CBC
HEMATOCRIT: 40.7 % (ref 40.0–52.0)
Hemoglobin: 13.8 g/dL (ref 13.0–18.0)
MCH: 29.1 pg (ref 26.0–34.0)
MCHC: 34 g/dL (ref 32.0–36.0)
MCV: 85.7 fL (ref 80.0–100.0)
Platelets: 235 10*3/uL (ref 150–440)
RBC: 4.75 MIL/uL (ref 4.40–5.90)
RDW: 14.7 % — AB (ref 11.5–14.5)
WBC: 10.3 10*3/uL (ref 3.8–10.6)

## 2015-11-01 LAB — HEPATIC FUNCTION PANEL
ALK PHOS: 60 U/L (ref 38–126)
ALT: 17 U/L (ref 17–63)
AST: 21 U/L (ref 15–41)
Albumin: 4 g/dL (ref 3.5–5.0)
BILIRUBIN TOTAL: 0.7 mg/dL (ref 0.3–1.2)
Bilirubin, Direct: <0.1 mg/dL — ABNORMAL LOW (ref 0.1–0.5)
TOTAL PROTEIN: 7.1 g/dL (ref 6.5–8.1)

## 2015-11-01 LAB — BASIC METABOLIC PANEL
ANION GAP: 9 (ref 5–15)
BUN: 16 mg/dL (ref 6–20)
CHLORIDE: 99 mmol/L — AB (ref 101–111)
CO2: 25 mmol/L (ref 22–32)
CREATININE: 1.44 mg/dL — AB (ref 0.61–1.24)
Calcium: 9.4 mg/dL (ref 8.9–10.3)
GFR calc Af Amer: >60 mL/min (ref >60)
GFR calc non Af Amer: 53 mL/min — ABNORMAL LOW (ref >60)
GLUCOSE: 273 mg/dL — AB (ref 65–99)
POTASSIUM: 4.1 mmol/L (ref 3.5–5.1)
Sodium: 133 mmol/L — ABNORMAL LOW (ref 135–145)

## 2015-11-01 LAB — TROPONIN I: Troponin I: <0.03 ng/mL (ref <0.03)

## 2015-11-01 LAB — LIPASE, BLOOD: Lipase: 34 U/L (ref 11–51)

## 2015-11-01 MED ORDER — SODIUM CHLORIDE 0.9 % IV BOLUS (SEPSIS)
1000.0000 mL | Freq: Once | INTRAVENOUS | Status: AC
  Administered 2015-11-01: 1000 mL via INTRAVENOUS

## 2015-11-01 MED ORDER — ONDANSETRON HCL 4 MG/2ML IJ SOLN
4.0000 mg | Freq: Once | INTRAMUSCULAR | Status: AC
  Administered 2015-11-01: 4 mg via INTRAVENOUS
  Filled 2015-11-01: qty 2

## 2015-11-01 MED ORDER — AZITHROMYCIN 250 MG PO TABS
ORAL_TABLET | ORAL | 0 refills | Status: AC
Start: 2015-11-01 — End: 2015-11-06

## 2015-11-01 NOTE — ED Triage Notes (Signed)
Pt reports to ED with multiple complaints, c/o jaw pain, shortness of breath, lightheadness and congestion with chest pain.  Pt ambulatory in triage. Recently diagnosed with infection of his glands and treated with antibiotics.

## 2015-11-01 NOTE — ED Triage Notes (Signed)
C/O SOB and chest tightness with productive cough and nausea x 10 days.

## 2015-11-01 NOTE — ED Provider Notes (Signed)
[facility] Emergency Department Provider Note  Time seen: 9:00 AM  I have reviewed the triage vital signs and the nursing notes.   HISTORY  Chief Complaint Shortness of Breath and Chest Pain    HPI Matthew Montes is a 55 y.o. male With a past medical history of asthma, possible COPD, diabetes, hypertension, schizophrenia, who presents the emergency department with cough, chest pain and sputum production. According to the patient for the past one month he has had a frequent cough, he states over the past several days he has been feeling nauseated, coughing up yellow sputum with intermittent chest pain. Patient states the chest pain is sharp, intermittent, denies any currently. Patient has been coughing. Yellow sputum but denies fever. Denies leg pain or swelling. Patient states he was recently treated for an infection in his throat, and states his throat is starting to hurt him again. Denies any trouble speaking or swallowing.  Past Medical History:  Diagnosis Date  . ADHD (attention deficit hyperactivity disorder)   . Asthma   . Cardiomegaly   . Chronic back pain   . Chronic neck pain   . COPD (chronic obstructive pulmonary disease) (HCC)   . Diabetes mellitus without complication (HCC)   . Hypertension   . Liver failure (HCC)   . Schizophrenia [facility](HCC)     Patient Active Problem List   Diagnosis Date Noted  . Cellulitis of submandibular region 10/10/2015    Past Surgical History:  Procedure Laterality Date  . BACK SURGERY    . NECK SURGERY    . SPINAL FUSION      Prior to Admission medications   Medication Sig Start Date End Date Taking? Authorizing Provider  amoxicillin-clavulanate (AUGMENTIN) 875-125 MG tablet Take 1 tablet by mouth 2 (two) times daily. 10/11/15   Houston SirenVivek J Sainani, MD  aspirin EC 81 MG tablet Take 81 mg by mouth every morning.    Historical Provider, MD  atorvastatin (LIPITOR) 80 MG tablet Take 80 mg by mouth every morning.  10/08/15 10/07/16  Historical Provider, MD  diazepam (VALIUM) 5 MG tablet Take 5 mg by mouth 3 (three) times daily.    Historical Provider, MD  doxepin (SINEQUAN) 50 MG capsule Take 50 mg by mouth at bedtime. 09/02/15   Historical Provider, MD  DULoxetine (CYMBALTA) 60 MG capsule Take 60 mg by mouth 2 (two) times daily. 08/14/15   Historical Provider, MD  esomeprazole (NEXIUM) 40 MG capsule Take 40 mg by mouth daily before breakfast. 10/07/15   Historical Provider, MD  glipiZIDE (GLUCOTROL) 5 MG tablet Take 5 mg by mouth 2 (two) times daily. 09/16/15   Historical Provider, MD  LANTUS SOLOSTAR 100 UNIT/ML Solostar Pen Inject 60 Units into the skin at bedtime. 10/07/15   Historical Provider, MD  lisinopril (PRINIVIL,ZESTRIL) 5 MG tablet Take 5 mg by mouth every morning.  09/18/15   Historical Provider, MD  LYRICA 150 MG capsule Take 150 mg by mouth 2 (two) times daily. 09/10/15   Historical Provider, MD  metFORMIN (GLUCOPHAGE) 1000 MG tablet Take 1,000 mg by mouth 2 (two) times daily with a meal. 10/07/15   Historical Provider, MD  metoprolol succinate (TOPROL-XL) 50 MG 24 hr tablet Take 50 mg by mouth every morning.  10/07/15   Historical Provider, MD  OLANZapine (ZYPREXA) 5 MG tablet Take 5 mg by mouth at bedtime. 08/14/15   Historical Provider, MD  predniSONE (DELTASONE) 10 MG tablet Label  & dispense according to the schedule below. 5 Pills  PO for 1 day then, 4 Pills PO for 1 day, 3 Pills PO for 1 day, 2 Pills PO for 1 day, 1 Pill PO for 1 days then STOP. 10/11/15   Houston Siren, MD  SYMBICORT 160-4.5 MCG/ACT inhaler Inhale 2 puffs into the lungs 2 (two) times daily. 10/08/15   Historical Provider, MD  tamsulosin (FLOMAX) 0.4 MG CAPS capsule Take 0.4 mg by mouth every morning.  10/07/15   Historical Provider, MD  traMADol (ULTRAM) 50 MG tablet Take 50 mg by mouth every 8 (eight) hours as needed for pain. 09/10/15   Historical Provider, MD  triamcinolone ointment (KENALOG) 0.1 % Apply 1 application topically 2  (two) times daily. 10/07/15   Historical Provider, MD  VENTOLIN HFA 108 (90 Base) MCG/ACT inhaler Inhale 2 puffs into the lungs every 4 (four) hours as needed for shortness of breath or wheezing. 10/07/15   Historical Provider, MD  VOLTAREN 1 % GEL Apply 2 g topically 4 (four) times daily. 09/10/15   Historical Provider, MD    No Known Allergies  Family History  Problem Relation Age of Onset  . Heart attack Mother   . Heart disease Father   . Diabetes Father     Social History Social History  Substance Use Topics  . Smoking status: Never Smoker  . Smokeless tobacco: Current User    Types: Chew  . Alcohol use Yes     Comment: 3 drinks/month.     Review of Systems Constitutional: Negative for fever. ENT: Positive for sore throat. Cardiovascular: Intermittent chest pain Respiratory: Negative for shortness of breath.positive for intermittent cough Gastrointestinal: Negative for abdominal pain Positive for nausea. Negative for vomiting. Genitourinary: Negative for dysuria. Musculoskeletal: Negative for back pain. Neurological: Negative for headache 10-point ROS otherwise negative.  ____________________________________________   PHYSICAL EXAM:  VITAL SIGNS: ED Triage Vitals  Enc Vitals Group     BP 11/01/15 0825 (!) 149/76     Pulse Rate 11/01/15 0825 86     Resp 11/01/15 0825 15     Temp 11/01/15 0825 98.4 F (36.9 C)     Temp Source 11/01/15 0825 Oral     SpO2 11/01/15 0825 99 %     Weight 11/01/15 0826 244 lb (110.7 kg)     Height 11/01/15 0826 5\' 11"  (1.803 m)     Head Circumference --      Peak Flow --      Pain Score 11/01/15 0828 0     Pain Loc --      Pain Edu? --      Excl. in GC? --     Constitutional: Alert and oriented. Well appearing and in no distress. Eyes: Normal exam ENT   Head: Normocephalic and atraumatic.   Mouth/Throat: Mucous membranes are moist. Cardiovascular: Normal rate, regular rhythm. No murmur Respiratory: Normal respiratory  effort without tachypnea nor retractions. Breath sounds are clear  Gastrointestinal: Soft and nontender. No distention.  Musculoskeletal: Nontender with normal range of motion in all extremities. No lower extremity tenderness or edema. Neurologic:  Normal speech and language. No gross focal neurologic deficits  Skin:  Skin is warm, and intact.  Psychiatric: Mood and affect are normal.   ____________________________________________    EKG  EKG reviewed and interpreted by myself shows normal sinus rhythm at 89 bpm, narrow QRS, normal axis, normal intervals, no ST changes, overall normal EKG.  ____________________________________________    RADIOLOGY  Chest x-ray largely negative  ____________________________________________   INITIAL IMPRESSION /  ASSESSMENT AND PLAN / ED COURSE  Pertinent labs & imaging results that were available during my care of the patient were reviewed by me and considered in my medical decision making (see chart for details).  The patient presents to the emergency department not feeling well. He describes cough 1 week, sputum production and sore throat. Overall the patient's exam is largely normal. Clear lung sounds, no lower extremity edema or tenderness, normal appearing pharynx without edema or erythema. Patient denies fever. Does state intermittent chest pains, states a normal cardiac catheterization 6 months ago. We will check labs including troponin, chest x-ray, and closely monitor in the emergency department. Patient agreeable to this plan.  Labs are largely within normal limits. Troponin is negative. Lipase is negative. Hepatic function panel negative. Patient's EKG is reassuring, chest x-ray is clear. Given the patient's cough with sputum production we'll cover with Zithromax for possible bronchitis. Patient agreeable plan. ____________________________________________   FINAL CLINICAL IMPRESSION(S) / ED DIAGNOSES  Upper respiratory  infection Bronchitis   Minna Antis, MD 11/01/15 1215

## 2016-04-20 IMAGING — US ABDOMEN ULTRASOUND LIMITED
1 series · 14 of 25 positions shown · non-contrast
Comparison: CT abdomen and pelvis December 07, 2008

CLINICAL DATA: Elevated liver enzymes

EXAM:
US ABDOMEN LIMITED - RIGHT UPPER QUADRANT

[Series 1: abdomen ultrasound limited · 0.24mm/px · 14 of 53 slices shown]
[im 1/53]
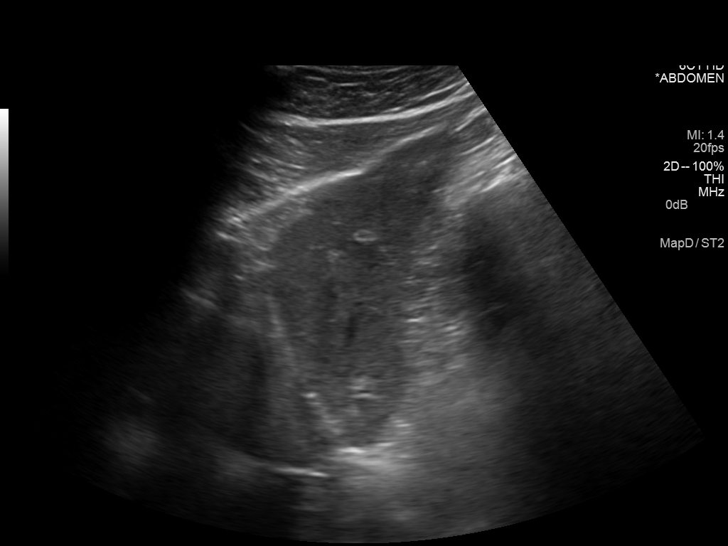
[im 5/53]
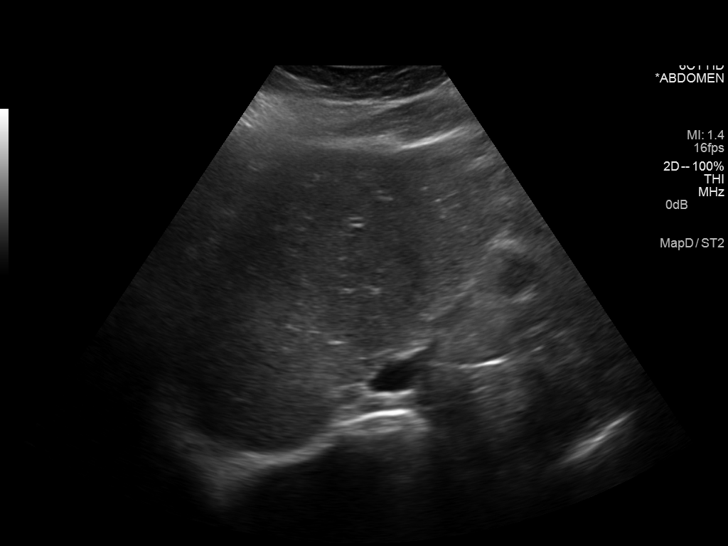
[im 9/53]
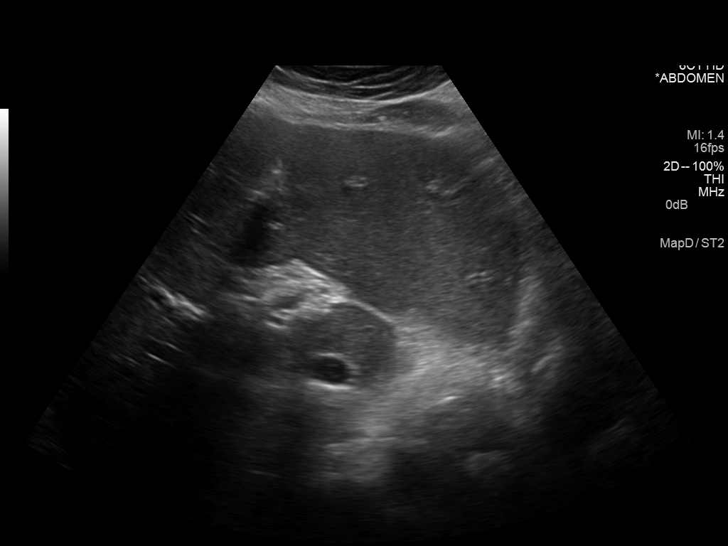
[im 14/53]
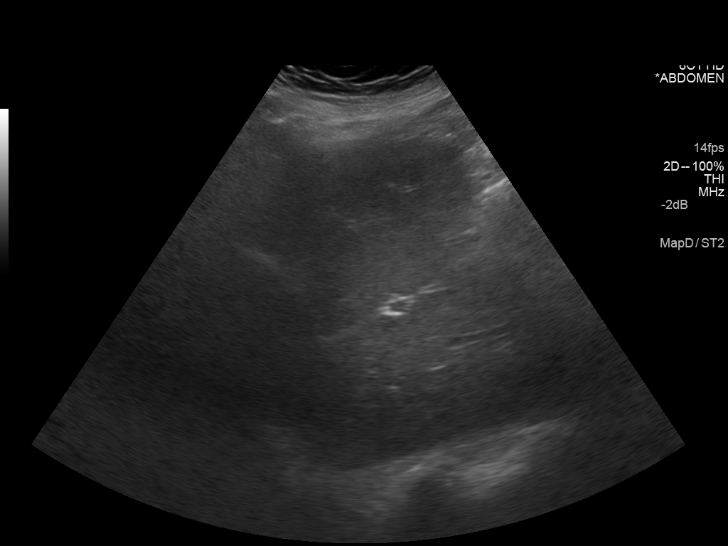
[im 18/53]
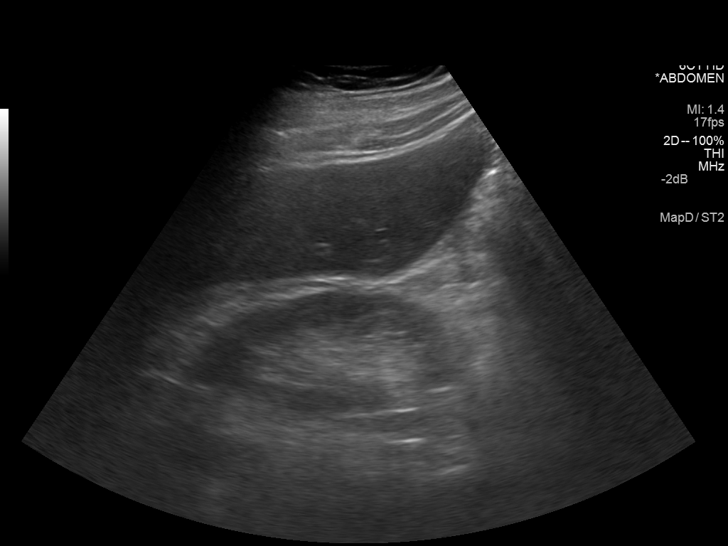
[im 20/53]
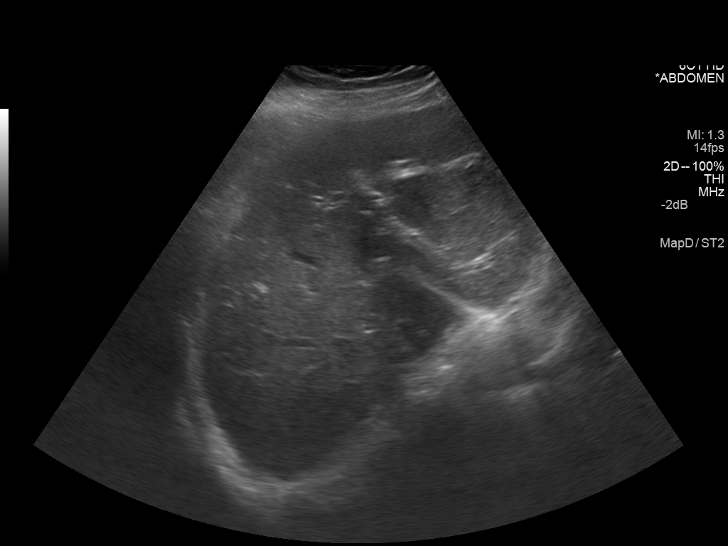
[im 24/53]
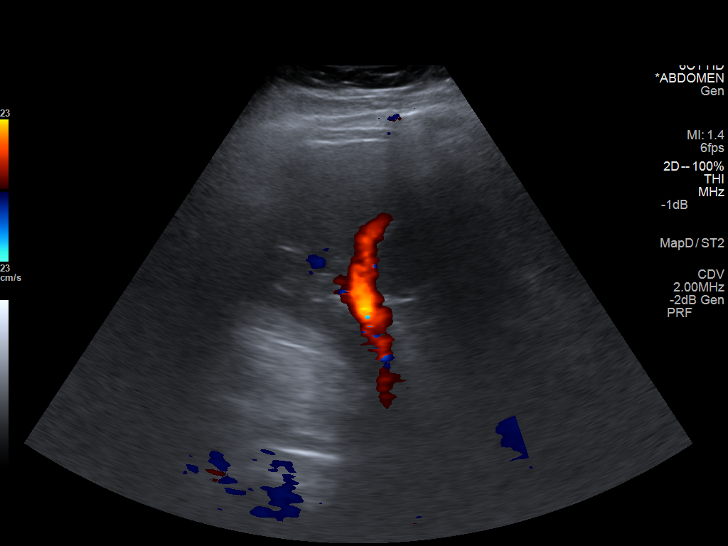
[im 29/53]
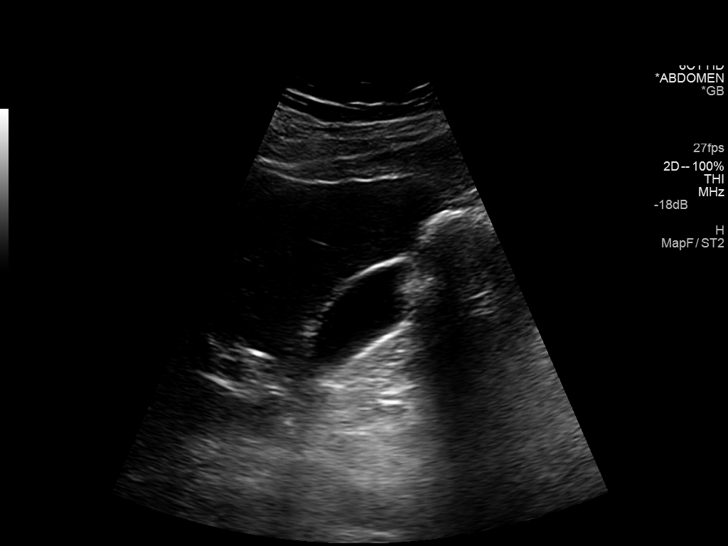
[im 33/53]
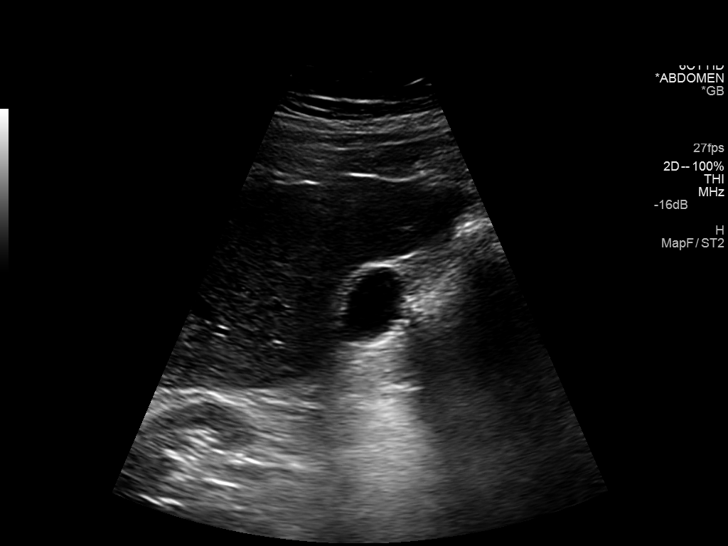
[im 35/53]
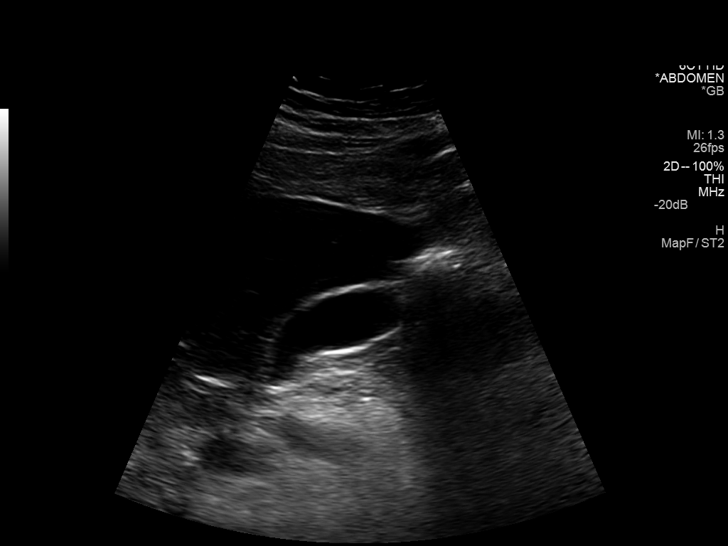
[im 40/53]
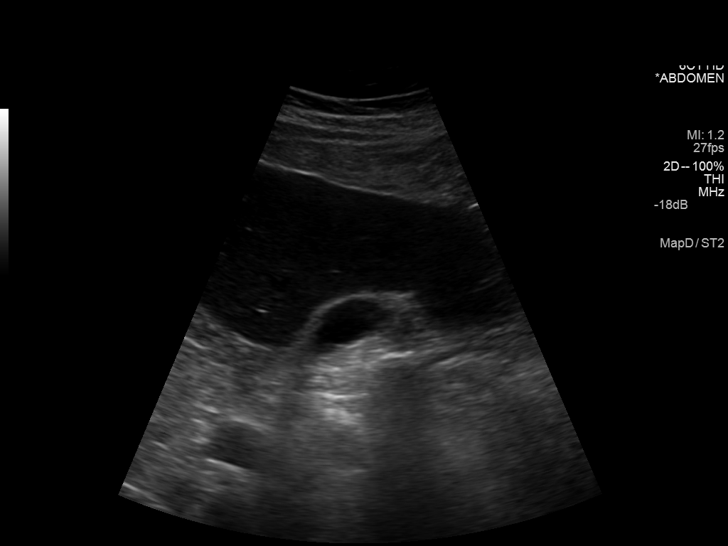
[im 44/53]
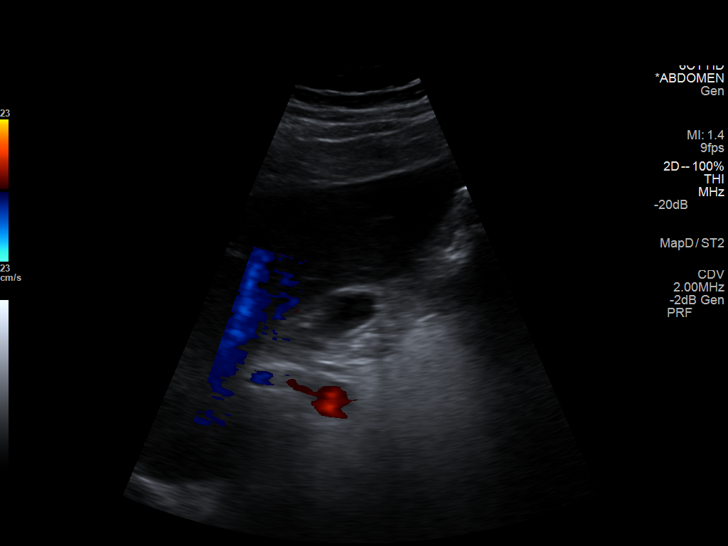
[im 48/53]
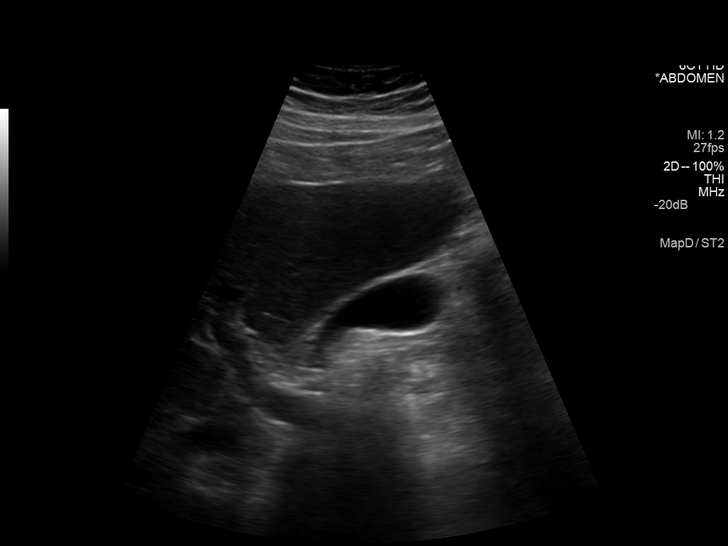
[im 53/53]
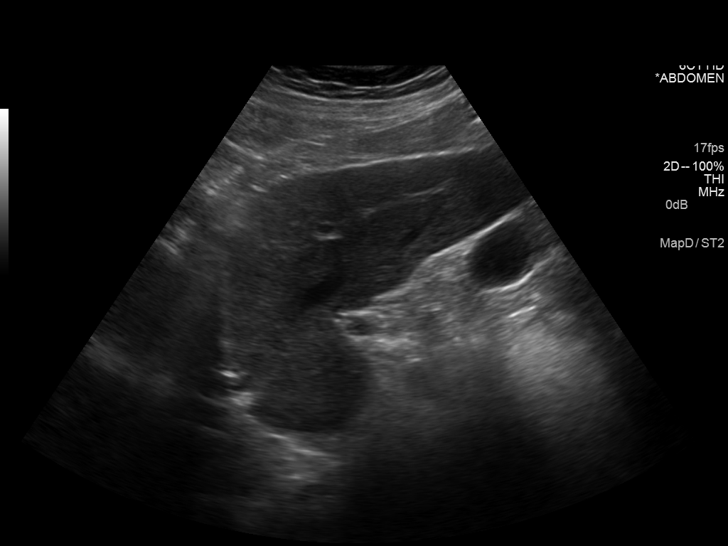

[14 of 25 positions shown; findings below may reference images not displayed]

FINDINGS: Gallbladder:

No gallstones or wall thickening visualized. There is no
pericholecystic fluid. No sonographic Murphy sign noted.

Common bile duct:

Diameter: 5 mm. There is no intrahepatic or extrahepatic biliary
duct dilatation.

Liver:

No focal lesion identified. Within normal limits in parenchymal
echogenicity. Liver contour is subtly nodular.
IMPRESSION: Liver contour is subtly nodular, a finding that may indicate some
underlying parenchymal disease such as early cirrhosis. Note,
however, that the liver echotexture appears normal, and no focal
liver lesions are identified. Study otherwise unremarkable.

## 2016-06-13 ENCOUNTER — Emergency Department
Admission: EM | Admit: 2016-06-13 | Discharge: 2016-06-13 | Disposition: A | Discharge status: Home / Self Care | Payer: Medicare HMO | Attending: Emergency Medicine | Admitting: Emergency Medicine

## 2016-06-13 ENCOUNTER — Emergency Department: Payer: Medicare HMO

## 2016-06-13 ENCOUNTER — Encounter: Payer: Self-pay | Admitting: Emergency Medicine

## 2016-06-13 DIAGNOSIS — M62830 Muscle spasm of back: Secondary | ICD-10-CM | POA: Diagnosis not present

## 2016-06-13 DIAGNOSIS — E119 Type 2 diabetes mellitus without complications: Secondary | ICD-10-CM | POA: Diagnosis not present

## 2016-06-13 DIAGNOSIS — F17290 Nicotine dependence, other tobacco product, uncomplicated: Secondary | ICD-10-CM | POA: Insufficient documentation

## 2016-06-13 DIAGNOSIS — F909 Attention-deficit hyperactivity disorder, unspecified type: Secondary | ICD-10-CM | POA: Insufficient documentation

## 2016-06-13 DIAGNOSIS — Z79899 Other long term (current) drug therapy: Secondary | ICD-10-CM | POA: Insufficient documentation

## 2016-06-13 DIAGNOSIS — J45909 Unspecified asthma, uncomplicated: Secondary | ICD-10-CM | POA: Insufficient documentation

## 2016-06-13 DIAGNOSIS — M549 Dorsalgia, unspecified: Secondary | ICD-10-CM | POA: Diagnosis present

## 2016-06-13 DIAGNOSIS — J449 Chronic obstructive pulmonary disease, unspecified: Secondary | ICD-10-CM | POA: Diagnosis not present

## 2016-06-13 DIAGNOSIS — I10 Essential (primary) hypertension: Secondary | ICD-10-CM | POA: Insufficient documentation

## 2016-06-13 DIAGNOSIS — Z7982 Long term (current) use of aspirin: Secondary | ICD-10-CM | POA: Insufficient documentation

## 2016-06-13 DIAGNOSIS — R300 Dysuria: Secondary | ICD-10-CM | POA: Insufficient documentation

## 2016-06-13 DIAGNOSIS — Z794 Long term (current) use of insulin: Secondary | ICD-10-CM | POA: Diagnosis not present

## 2016-06-13 LAB — URINALYSIS, COMPLETE (UACMP) WITH MICROSCOPIC
Bacteria, UA: NONE SEEN
Bilirubin Urine: NEGATIVE
Glucose, UA: NEGATIVE mg/dL
Hgb urine dipstick: NEGATIVE
Ketones, ur: NEGATIVE mg/dL
Leukocytes, UA: NEGATIVE
Nitrite: NEGATIVE
Protein, ur: NEGATIVE mg/dL
RBC / HPF: NONE SEEN RBC/hpf (ref 0–5)
Specific Gravity, Urine: 1.012 (ref 1.005–1.030)
Squamous Epithelial / HPF: NONE SEEN
pH: 6 (ref 5.0–8.0)

## 2016-06-13 MED ORDER — CYCLOBENZAPRINE HCL 5 MG PO TABS: 5.0000 mg | ORAL_TABLET | Freq: Three times a day (TID) | ORAL | 0 refills | Status: AC | PRN

## 2016-06-13 NOTE — ED Notes (Signed)
Pt states hx renal, hepatic disorders. Pt states sudden onset left back pain, stabbing, shooting pain. Pt denies hx kindey stone, but reports painful urination x3days.

## 2016-06-13 NOTE — ED Provider Notes (Signed)
[facility] Emergency Department Provider Note  ____________________________________________  Time seen: Approximately 8:21 PM  I have reviewed the triage vital signs and the nursing notes.   HISTORY  Chief Complaint Back Pain    HPI Matthew Montes is a 56 y.o. male with a history of schizophrenia presents to the emergency department with left sided upper back pain and dysuria for the past 3 days. Patient states that his upper back pain is 9 out of 10 in intensity. Patient denies acute radiculopathy or weakness. Patient states that he has increased urinary frequency at baseline due to diabetes. He denies hematuria. He denies a history of nephrolithiasis. Patient denies saddle anesthesia or incontinence. Patient states that he has had 3 prior surgeries of the back and neck. Patient denies new falls or traumas. No alleviating measures have been attempted. Patient denies chest pain, chest tightness, shortness of breath, nausea, vomiting and abdominal pain.   Past Medical History:  Diagnosis Date  . ADHD (attention deficit hyperactivity disorder)   . Asthma   . Cardiomegaly   . Chronic back pain   . Chronic neck pain   . COPD (chronic obstructive pulmonary disease) (HCC)   . Diabetes mellitus without complication (HCC)   . Hypertension   . Liver failure (HCC)   . Schizophrenia [facility])     Patient Active Problem List   Diagnosis Date Noted  . Cellulitis of submandibular region 10/10/2015    Past Surgical History:  Procedure Laterality Date  . BACK SURGERY    . NECK SURGERY    . SPINAL FUSION      Prior to Admission medications   Medication Sig Start Date End Date Taking? Authorizing Provider  amoxicillin-clavulanate (AUGMENTIN) 875-125 MG tablet Take 1 tablet by mouth 2 (two) times daily. Patient not taking: Reported on 11/01/2015 10/11/15   Houston Siren, MD  aspirin EC 81 MG tablet Take 81 mg by mouth every morning.    Historical Provider, MD   atorvastatin (LIPITOR) 80 MG tablet Take 80 mg by mouth every morning. 10/08/15 10/07/16  Historical Provider, MD  cyclobenzaprine (FLEXERIL) 5 MG tablet Take 1 tablet (5 mg total) by mouth 3 (three) times daily as needed for muscle spasms. 06/13/16 06/16/16  Dayna Barker Sula Fetterly, PA-C  doxepin (SINEQUAN) 50 MG capsule Take 50 mg by mouth at bedtime. 09/02/15   Historical Provider, MD  DULoxetine (CYMBALTA) 60 MG capsule Take 60 mg by mouth 2 (two) times daily. 08/14/15   Historical Provider, MD  esomeprazole (NEXIUM) 40 MG capsule Take 40 mg by mouth daily before breakfast. 10/07/15   Historical Provider, MD  glipiZIDE (GLUCOTROL) 5 MG tablet Take 5 mg by mouth 2 (two) times daily. 09/16/15   Historical Provider, MD  LANTUS SOLOSTAR 100 UNIT/ML Solostar Pen Inject 60 Units into the skin at bedtime. 10/07/15   Historical Provider, MD  lisinopril (PRINIVIL,ZESTRIL) 5 MG tablet Take 5 mg by mouth every morning.  09/18/15   Historical Provider, MD  LYRICA 100 MG capsule Take 100 mg by mouth 2 (two) times daily.  09/10/15   Historical Provider, MD  metFORMIN (GLUCOPHAGE) 1000 MG tablet Take 1,000 mg by mouth 2 (two) times daily with a meal. 10/07/15   Historical Provider, MD  metoprolol succinate (TOPROL-XL) 50 MG 24 hr tablet Take 50 mg by mouth every morning.  10/07/15   Historical Provider, MD  OLANZapine (ZYPREXA) 7.5 MG tablet Take 7.5 mg by mouth at bedtime.  08/14/15   Historical Provider, MD  predniSONE (DELTASONE) 10 MG tablet Label  & dispense according to the schedule below. 5 Pills PO for 1 day then, 4 Pills PO for 1 day, 3 Pills PO for 1 day, 2 Pills PO for 1 day, 1 Pill PO for 1 days then STOP. Patient not taking: Reported on 11/01/2015 10/11/15   Houston Siren, MD  SYMBICORT 160-4.5 MCG/ACT inhaler Inhale 2 puffs into the lungs 2 (two) times daily. 10/08/15   Historical Provider, MD  tamsulosin (FLOMAX) 0.4 MG CAPS capsule Take 0.4 mg by mouth every morning.  10/07/15   Historical Provider, MD  traMADol (ULTRAM)  50 MG tablet Take 50 mg by mouth every 8 (eight) hours as needed for pain. 09/10/15   Historical Provider, MD  triamcinolone ointment (KENALOG) 0.1 % Apply 1 application topically 2 (two) times daily. 10/07/15   Historical Provider, MD  VENTOLIN HFA 108 (90 Base) MCG/ACT inhaler Inhale 2 puffs into the lungs every 4 (four) hours as needed for shortness of breath or wheezing. 10/07/15   Historical Provider, MD  VOLTAREN 1 % GEL Apply 2 g topically 4 (four) times daily. 09/10/15   Historical Provider, MD    Allergies Patient has no known allergies.  Family History  Problem Relation Age of Onset  . Heart attack Mother   . Heart disease Father   . Diabetes Father     Social History Social History  Substance Use Topics  . Smoking status: Former Games developer  . Smokeless tobacco: Current User    Types: Chew  . Alcohol use Yes     Comment: 3 drinks/month.      Review of Systems  Constitutional: No fever/chills Eyes: No visual changes. No discharge ENT: No upper respiratory complaints. Cardiovascular: no chest pain. Respiratory: no cough. No SOB. Gastrointestinal: No abdominal pain.  No nausea, no vomiting.  No diarrhea.  No constipation. Genitourinary: Patient has dysuria. Musculoskeletal: Patient has upper back pain without radiculopathy. Skin: Patient has numerous abrasions of the face. Neurological: Negative for headaches, focal weakness or numbness. ____________________________________________   PHYSICAL EXAM:  VITAL SIGNS: ED Triage Vitals  Enc Vitals Group     BP 06/13/16 1922 (!) 135/109     Pulse Rate 06/13/16 1922 80     Resp 06/13/16 1922 20     Temp 06/13/16 1922 98.2 F (36.8 C)     Temp Source 06/13/16 1922 Oral     SpO2 06/13/16 1922 100 %     Weight 06/13/16 1923 228 lb (103.4 kg)     Height 06/13/16 1923  (1.803 m)     Head Circumference --      Peak Flow --      Pain Score 06/13/16 1922 9     Pain Loc --      Pain Edu? --      Excl. in GC? --       Constitutional: Alert and oriented. Well appearing and in no acute distress. Eyes: Conjunctivae are normal. PERRL. EOMI. Head: Atraumatic. Hematological/Lymphatic/Immunilogical: No cervical lymphadenopathy. Cardiovascular: Normal rate, regular rhythm. Normal S1 and S2.  Good peripheral circulation. Respiratory: Normal respiratory effort without tachypnea or retractions. Lungs CTAB. Good air entry to the bases with no decreased or absent breath sounds. Gastrointestinal: Bowel sounds 4 quadrants. Soft and nontender to palpation. No guarding or rigidity. No palpable masses. No distention. No CVA tenderness. Musculoskeletal: Patient has 5 out of 5 strength in the upper and lower extremities bilaterally. Patient has full range of motion of the  upper and lower extremities bilaterally. Patient has left-sided paraspinal tenderness along T10-T12. Negative straight leg raise test bilaterally. Palpable radial, ulnar and dorsalis pedis pulses bilaterally and symmetrically. Neurologic:  Normal speech and language. No gross focal neurologic deficits are appreciated.  Skin:  Patient has numerous facial abrasions. Psychiatric: Mood and affect are normal. Speech and behavior are normal. Patient exhibits appropriate insight and judgement.   ____________________________________________   LABS (all labs ordered are listed, but only abnormal results are displayed)  Labs Reviewed  URINALYSIS, COMPLETE (UACMP) WITH MICROSCOPIC - Abnormal; Notable for the following:       Result Value   Color, Urine YELLOW (*)    APPearance CLEAR (*)    All other components within normal limits   ____________________________________________  EKG   ____________________________________________  RADIOLOGY Geraldo Pitter, personally viewed and evaluated these images (plain radiographs) as part of my medical decision making, as well as reviewing the written report by the radiologist.    Dg Thoracic Spine 2  View  Result Date: 06/13/2016 CLINICAL DATA:  Upper back pain.  Multiple falls. EXAM: THORACIC SPINE 2 VIEWS COMPARISON:  11/01/2015 chest radiographs FINDINGS: Partially visualized surgical hardware from ACDF in the lower cervical spine. Thoracic vertebral body heights appear preserved with no thoracic spine fracture or subluxation. Moderate multilevel thoracic degenerative disc disease. No suspicious focal osseous lesion. Mild anterior L1 vertebral compression fracture is stable since 11/03/2013 CT abdomen/pelvis. IMPRESSION: 1. No fracture or subluxation in the thoracic spine. 2. 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 3. Chronic mild anterior L1 vertebral compression fracture. Electronically Signed   By: Delbert Phenix M.D.   On: 06/13/2016 20:45    ____________________________________________    PROCEDURES  Procedure(s) performed:    Procedures    Medications - No data to display   ____________________________________________   INITIAL IMPRESSION / ASSESSMENT AND PLAN / ED COURSE  Pertinent labs & imaging results that were available during my care of the patient were reviewed by me and considered in my medical decision making (see chart for details).  Review of the [place] CSRS was performed in accordance of the [facility] prior to dispensing any controlled drugs.     Assessment and plan: Muscle Spasm of Back: Patient presents to the emergency department with upper back pain and dysuria for the past 3 days. DG thoracic spine reveals no acute bony abnormality. Urinalysis was noncontributory for cystitis. On physical exam, patient had left-sided paraspinal tenderness in the distribution of T10-T12. Neurologic exam and overall physical exam was reassuring. Patient was discharged with Flexeril. Vital signs are reassuring prior to discharge. All patient questions were answered.   ____________________________________________  FINAL CLINICAL IMPRESSION(S) / ED  DIAGNOSES  Final diagnoses:  Muscle spasm of back      NEW MEDICATIONS STARTED DURING THIS VISIT:  New Prescriptions   CYCLOBENZAPRINE (FLEXERIL) 5 MG TABLET    Take 1 tablet (5 mg total) by mouth 3 (three) times daily as needed for muscle spasms.        This chart was dictated using voice recognition software/Dragon. Despite best efforts to proofread, errors can occur which can change the meaning. Any change was purely unintentional.    Orvil Feil, PA-C 06/13/16 2135    Phineas Semen, MD 06/13/16 2202

## 2016-06-13 NOTE — ED Triage Notes (Signed)
Pt presents to ED with sudden onset of lower back pain since this morning. Back is painful to touch and pain increases with movement. Pt denies any known injury.

## 2016-06-19 ENCOUNTER — Emergency Department
Admission: EM | Admit: 2016-06-19 | Discharge: 2016-06-20 | Disposition: A | Discharge status: Home / Self Care | Payer: Medicare HMO | Attending: Emergency Medicine | Admitting: Emergency Medicine

## 2016-06-19 ENCOUNTER — Encounter: Payer: Self-pay | Admitting: Emergency Medicine

## 2016-06-19 ENCOUNTER — Emergency Department: Payer: Medicare HMO

## 2016-06-19 DIAGNOSIS — I10 Essential (primary) hypertension: Secondary | ICD-10-CM | POA: Diagnosis not present

## 2016-06-19 DIAGNOSIS — K529 Noninfective gastroenteritis and colitis, unspecified: Secondary | ICD-10-CM

## 2016-06-19 DIAGNOSIS — F209 Schizophrenia, unspecified: Secondary | ICD-10-CM | POA: Diagnosis not present

## 2016-06-19 DIAGNOSIS — R112 Nausea with vomiting, unspecified: Secondary | ICD-10-CM

## 2016-06-19 DIAGNOSIS — Z87891 Personal history of nicotine dependence: Secondary | ICD-10-CM | POA: Diagnosis not present

## 2016-06-19 DIAGNOSIS — J449 Chronic obstructive pulmonary disease, unspecified: Secondary | ICD-10-CM | POA: Diagnosis not present

## 2016-06-19 DIAGNOSIS — J45909 Unspecified asthma, uncomplicated: Secondary | ICD-10-CM | POA: Diagnosis not present

## 2016-06-19 DIAGNOSIS — E119 Type 2 diabetes mellitus without complications: Secondary | ICD-10-CM | POA: Diagnosis not present

## 2016-06-19 DIAGNOSIS — R197 Diarrhea, unspecified: Secondary | ICD-10-CM

## 2016-06-19 DIAGNOSIS — E871 Hypo-osmolality and hyponatremia: Secondary | ICD-10-CM | POA: Insufficient documentation

## 2016-06-19 DIAGNOSIS — K6289 Other specified diseases of anus and rectum: Secondary | ICD-10-CM | POA: Insufficient documentation

## 2016-06-19 HISTORY — DX: Disorder of kidney and ureter, unspecified: N28.9

## 2016-06-19 LAB — CBC
HCT: 37.8 % — ABNORMAL LOW (ref 40.0–52.0)
HEMOGLOBIN: 13.1 g/dL (ref 13.0–18.0)
MCH: 28.9 pg (ref 26.0–34.0)
MCHC: 34.5 g/dL (ref 32.0–36.0)
MCV: 83.8 fL (ref 80.0–100.0)
PLATELETS: 298 10*3/uL (ref 150–440)
RBC: 4.51 MIL/uL (ref 4.40–5.90)
RDW: 14.8 % — AB (ref 11.5–14.5)
WBC: 8 10*3/uL (ref 3.8–10.6)

## 2016-06-19 LAB — BASIC METABOLIC PANEL
ANION GAP: 9 (ref 5–15)
ANION GAP: 7 (ref 5–15)
BUN: 15 mg/dL (ref 6–20)
BUN: 13 mg/dL (ref 6–20)
CALCIUM: 9.4 mg/dL (ref 8.9–10.3)
CO2: 25 mmol/L (ref 22–32)
CO2: 25 mmol/L (ref 22–32)
CREATININE: 1.1 mg/dL (ref 0.61–1.24)
Calcium: 8.4 mg/dL — ABNORMAL LOW (ref 8.9–10.3)
Chloride: 95 mmol/L — ABNORMAL LOW (ref 101–111)
Chloride: 98 mmol/L — ABNORMAL LOW (ref 101–111)
Creatinine, Ser: 0.83 mg/dL (ref 0.61–1.24)
GFR calc Af Amer: >60 mL/min (ref >60)
GFR calc non Af Amer: >60 mL/min (ref >60)
GLUCOSE: 247 mg/dL — AB (ref 65–99)
Glucose, Bld: 94 mg/dL (ref 65–99)
Potassium: 4.4 mmol/L (ref 3.5–5.1)
Potassium: 3.6 mmol/L (ref 3.5–5.1)
SODIUM: 130 mmol/L — AB (ref 135–145)
Sodium: 129 mmol/L — ABNORMAL LOW (ref 135–145)

## 2016-06-19 LAB — URINALYSIS, COMPLETE (UACMP) WITH MICROSCOPIC
Bacteria, UA: NONE SEEN
Bilirubin Urine: NEGATIVE
Glucose, UA: 50 mg/dL — AB
Hgb urine dipstick: NEGATIVE
Ketones, ur: NEGATIVE mg/dL
Leukocytes, UA: NEGATIVE
Nitrite: NEGATIVE
PH: 6 (ref 5.0–8.0)
Protein, ur: NEGATIVE mg/dL
RBC / HPF: NONE SEEN RBC/hpf (ref 0–5)
SPECIFIC GRAVITY, URINE: 1.006 (ref 1.005–1.030)
Squamous Epithelial / LPF: NONE SEEN
WBC, UA: NONE SEEN WBC/hpf (ref 0–5)

## 2016-06-19 MED ORDER — ONDANSETRON 4 MG PO TBDP: 4.0000 mg | ORAL_TABLET | Freq: Three times a day (TID) | ORAL | 0 refills | Status: DC | PRN

## 2016-06-19 MED ORDER — LOPERAMIDE HCL 2 MG PO TABS: 2.0000 mg | ORAL_TABLET | Freq: Four times a day (QID) | ORAL | 0 refills | Status: AC | PRN

## 2016-06-19 MED ORDER — FENTANYL CITRATE (PF) 100 MCG/2ML IJ SOLN
50.0000 ug | Freq: Once | INTRAMUSCULAR | Status: AC
  Administered 2016-06-19: 50 ug via INTRAVENOUS
  Filled 2016-06-19: qty 2

## 2016-06-19 MED ORDER — IOPAMIDOL (ISOVUE-300) INJECTION 61%
100.0000 mL | Freq: Once | INTRAVENOUS | Status: AC | PRN
  Administered 2016-06-19: 100 mL via INTRAVENOUS
  Filled 2016-06-19: qty 100

## 2016-06-19 MED ORDER — SODIUM CHLORIDE 0.9 % IV BOLUS (SEPSIS)
500.0000 mL | Freq: Once | INTRAVENOUS | Status: AC
  Administered 2016-06-19: 500 mL via INTRAVENOUS

## 2016-06-19 MED ORDER — IOPAMIDOL (ISOVUE-300) INJECTION 61%
30.0000 mL | Freq: Once | INTRAVENOUS | Status: AC | PRN
  Administered 2016-06-19: 30 mL via ORAL
  Filled 2016-06-19: qty 30

## 2016-06-19 MED ORDER — SODIUM CHLORIDE 0.9 % IV BOLUS (SEPSIS)
1000.0000 mL | Freq: Once | INTRAVENOUS | Status: AC
  Administered 2016-06-19: 1000 mL via INTRAVENOUS

## 2016-06-19 MED ORDER — ONDANSETRON HCL 4 MG/2ML IJ SOLN
4.0000 mg | Freq: Once | INTRAMUSCULAR | Status: AC
  Administered 2016-06-19: 4 mg via INTRAVENOUS
  Filled 2016-06-19: qty 2

## 2016-06-19 NOTE — ED Provider Notes (Signed)
[facility] Emergency Department Provider Note  ____________________________________________  Time seen: Approximately 3:20 PM  I have reviewed the triage vital signs and the nursing notes.   HISTORY  Chief Complaint Dizziness and Diarrhea    HPI Matthew Montes is a 56 y.o. male with a history of liver disease, COPD, DM, HTN, schizophrenia, presenting for nausea vomiting and diarrhea. The patient reports that for the last 3 days, it had multiple episodes of nausea and vomiting with loose stool. His last vomiting was this morning and since then he has tolerated a little bit of water. He is last diarrhea was here. He has had some lightheadedness with standing. He also reports "sharp" abdominal pains, worst in the lower part of the abdomen. No pain or burning with urination, fever or chills, or known sick contacts.   Past Medical History:  Diagnosis Date  . ADHD (attention deficit hyperactivity disorder)   . Asthma   . Cardiomegaly   . Chronic back pain   . Chronic neck pain   . COPD (chronic obstructive pulmonary disease) (HCC)   . Diabetes mellitus without complication (HCC)   . Hypertension   . Liver failure (HCC)   . Renal disorder   . Renal insufficiency   . Schizophrenia [facility])     Patient Active Problem List   Diagnosis Date Noted  . Cellulitis of submandibular region 10/10/2015    Past Surgical History:  Procedure Laterality Date  . BACK SURGERY    . NECK SURGERY    . SPINAL FUSION      Current Outpatient Rx  . Order #: [id number] Class: Print  . Order #: [id number] Class: Historical Med  . Order #: [id number] Class: Historical Med  . Order #: [id number] Class: Historical Med  . Order #: [id number] Class: Historical Med  . Order #: [id number] Class: Historical Med  . Order #: [id number] Class: Historical Med  . Order #: [id number] Class: Historical Med  . Order #: [id number] Class: Historical Med  . Order #: [id number] Class: Print  . Order #:  [id number] Class: Historical Med  . Order #: [id number] Class: Historical Med  . Order #: [id number] Class: Historical Med  . Order #: [id number] Class: Historical Med  . Order #: [id number] Class: Print  . Order #: [id number] Class: Print  . Order #: [id number] Class: Historical Med  . Order #: [id number] Class: Historical Med  . Order #: [id number] Class: Historical Med  . Order #: [id number] Class: Historical Med  . Order #: [id number] Class: Historical Med  . Order #: [id number] Class: Historical Med    Allergies Patient has no known allergies.  Family History  Problem Relation Age of Onset  . Heart attack Mother   . Heart disease Father   . Diabetes Father     Social History Social History  Substance Use Topics  . Smoking status: Former Games developer  . Smokeless tobacco: Current User    Types: Chew  . Alcohol use Yes     Comment: 3 drinks/month.     Review of Systems Constitutional: No fever/chills.Positive lightheadedness without syncope. Eyes: No visual changes. ENT: No sore throat. No congestion or rhinorrhea. Cardiovascular: Denies chest pain. Denies palpitations. Respiratory: Denies shortness of breath.  No cough. Gastrointestinal: Positive diffuse lower abdominal pain.  Positive nausea, positive vomiting.  Positive diarrhea.  No constipation. Genitourinary: Negative for dysuria. Musculoskeletal: Negative for back pain. Skin: Negative for rash. Neurological: Negative for headaches. No focal numbness, tingling or weakness.   10-point ROS otherwise negative.  ____________________________________________   PHYSICAL EXAM:  VITAL SIGNS: ED Triage Vitals  Enc Vitals Group  BP 06/19/16 1419 113/72     Pulse Rate 06/19/16 1419 95     Resp 06/19/16 1419 16     Temp 06/19/16 1419 98.7 F (37.1 C)     Temp Source 06/19/16 1419 Oral     SpO2 06/19/16 1419 100 %     Weight 06/19/16 1420 228 lb (103.4 kg)     Height 06/19/16 1420 5\' 11"  (1.803 m)     Head Circumference --      Peak  Flow --      Pain Score --      Pain Loc --      Pain Edu? --      Excl. in GC? --     Constitutional: Alert and oriented. Chronically ill appearing but in no acute distress. Answers questions appropriately. Eyes: Conjunctivae are normal.  EOMI. No scleral icterus. Head: Atraumatic. Nose: No congestion/rhinnorhea. Mouth/Throat: Mucous membranes are dry.  Neck: No stridor.  Supple.  No meningismus. Cardiovascular: Normal rate, regular rhythm. No murmurs, rubs or gallops.  Respiratory: Normal respiratory effort.  No accessory muscle use or retractions. Lungs CTAB.  No wheezes, rales or ronchi. Gastrointestinal: Obese. Soft, and mildly distended. Diffuse tenderness to palpation in the lower abdomen without focality No guarding or rebound.  No peritoneal signs. Musculoskeletal: No LE edema.  Neurologic:  A&Ox3.  Speech is clear.  Face and smile are symmetric.  EOMI.  Moves all extremities well. Skin:  Skin is warm, dry. The patient is noted to have a diffuse rash over the forehead, right cheek, left chest, of well-healing scabs that is being evaluated by dermatologist as an outpatient. Psychiatric: Mood and affect are normal. Speech and behavior are normal.  Normal judgement.  ____________________________________________   LABS (all labs ordered are listed, but only abnormal results are displayed)  Labs Reviewed  BASIC METABOLIC PANEL - Abnormal; Notable for the following:       Result Value   Sodium 129 (*)    Chloride 95 (*)    Glucose, Bld 247 (*)    All other components within normal limits  CBC - Abnormal; Notable for the following:    HCT 37.8 (*)    RDW 14.8 (*)    All other components within normal limits  URINALYSIS, COMPLETE (UACMP) WITH MICROSCOPIC - Abnormal; Notable for the following:    Color, Urine YELLOW (*)    APPearance CLEAR (*)    Glucose, UA 50 (*)    All other components within normal limits  BASIC METABOLIC PANEL - Abnormal; Notable for the following:     Sodium 130 (*)    Chloride 98 (*)    Calcium 8.4 (*)    All other components within normal limits  CBG MONITORING, ED   ____________________________________________  EKG  ED ECG REPORT I, Rockne MenghiniNorman, Anne-Caroline, the attending physician, personally viewed and interpreted this ECG.   Date: 06/19/2016  EKG Time: 1429  Rate: 101  Rhythm: sinus tachycardia  Axis: normal  Intervals:none  ST&T Change: No STEMI  ____________________________________________  RADIOLOGY  Ct Abdomen Pelvis W Contrast  Result Date: 06/19/2016 CLINICAL DATA:  56 year old male with abdominal and pelvic pain, nausea vomiting and diarrhea for 2 days. EXAM: CT ABDOMEN AND PELVIS WITH CONTRAST TECHNIQUE: Multidetector CT imaging of the abdomen and pelvis was performed using the standard protocol following bolus administration of intravenous contrast. CONTRAST:  100mL ISOVUE-300 IOPAMIDOL (ISOVUE-300) INJECTION 61% COMPARISON:  11/03/2013 CT FINDINGS: Lower chest: No acute abnormality. Coronary artery calcifications noted. Hepatobiliary: The  liver and gallbladder are unremarkable. There is no evidence of biliary dilatation. Pancreas: Unremarkable Spleen: Unremarkable Adrenals/Urinary Tract: The kidneys, adrenal glands and bladder are unremarkable. Stomach/Bowel: There is probable mild circumferential wall thickening of the sigmoid colon and rectum. There is no evidence of bowel obstruction or other bowel wall thickening. Vascular/Lymphatic: Aortic atherosclerotic calcifications noted without aneurysm. No enlarged lymph nodes identified. Reproductive: Prostate unremarkable. Other: No free fluid, abscess or pneumoperitoneum. A small to moderate umbilical hernia containing fat is unchanged. Musculoskeletal: No acute or suspicious abnormalities. Surgical changes within the lower lumbar spine/sacrum noted. IMPRESSION: Probable mild circumferential wall thickening of the sigmoid colon and rectum suggesting colitis/ proctitis.  Correlate clinically. No bowel obstruction, abscess or pneumoperitoneum. Abdominal aortic atherosclerosis. Coronary artery disease. Electronically Signed   By: Harmon Pier M.D.   On: 06/19/2016 17:40    ____________________________________________   PROCEDURES  Procedure(s) performed: None  Procedures  Critical Care performed: No ____________________________________________   INITIAL IMPRESSION / ASSESSMENT AND PLAN / ED COURSE  Pertinent labs & imaging results that were available during my care of the patient were reviewed by me and considered in my medical decision making (see chart for details).  56 y.o. male with multiple chronic conditions presenting with nausea vomiting and diarrhea for 3 days. Overall, the patient looks mildly dehydrated. His abdominal exam shows bilateral nonfocal lower abdominal pain, so a get a CT scan to evaluate for appendicitis or diverticulitis although foodborne or viral GI illness is more likely on the differential. The patient does have some hyponatremia with a sodium of 129 and hyperglycemia with a glucose of 247. I will treat him with an Trevino's fluids, and reevaluate his electrolytes. If the patient's workup is reassuring in the emergency department and his symptoms and electrolytes have improved, we will consider discharge at bedtime.   The patient is feeling much better at this time.  His nausea has resolved and he is tolerating clear liquids.  His repeat BMP shows an improving sodium and resolved hyperglycemia; as long as he tolerates liquids, these should continue to improve.  The patient remains afebrile with a normal wbc; CT shows colitis and proctatitis, but there is no indication for initiation of abx today.  Plan discharge w/ symptomatic tx, close PMD f/u.  Return precautions were discussed.  ____________________________________________  FINAL CLINICAL IMPRESSION(S) / ED DIAGNOSES  Final diagnoses:  Colitis  Proctitis  Hyponatremia   Nausea vomiting and diarrhea         NEW MEDICATIONS STARTED DURING THIS VISIT:  New Prescriptions   LOPERAMIDE (IMODIUM A-D) 2 MG TABLET    Take 1 tablet (2 mg total) by mouth 4 (four) times daily as needed for diarrhea or loose stools.   ONDANSETRON (ZOFRAN ODT) 4 MG DISINTEGRATING TABLET    Take 1 tablet (4 mg total) by mouth every 8 (eight) hours as needed for nausea or vomiting.      Rockne Menghini, MD 06/19/16 1843

## 2016-06-19 NOTE — Discharge Instructions (Signed)
Please take a clear liquid diet for the next 48 hours, then advance to a bland diet as tolerated.  You may use Zofran for nausea and loperamide for diarrhea.  Return to the emergency department if you develop severe pain, inability to keep down fluids, fever, or any other symptoms concerning to you.

## 2016-06-19 NOTE — ED Triage Notes (Signed)
Pt presents with C/O dizziness, Nausea, Vomiting, Diarrhea since Friday.

## 2016-06-26 ENCOUNTER — Emergency Department
Admission: EM | Admit: 2016-06-26 | Discharge: 2016-06-26 | Disposition: A | Discharge status: Home / Self Care | Payer: Medicare HMO | Attending: Student in an Organized Health Care Education/Training Program | Admitting: Student in an Organized Health Care Education/Training Program

## 2016-06-26 ENCOUNTER — Encounter: Payer: Self-pay | Admitting: Emergency Medicine

## 2016-06-26 DIAGNOSIS — J449 Chronic obstructive pulmonary disease, unspecified: Secondary | ICD-10-CM | POA: Insufficient documentation

## 2016-06-26 DIAGNOSIS — R112 Nausea with vomiting, unspecified: Secondary | ICD-10-CM

## 2016-06-26 DIAGNOSIS — Z7982 Long term (current) use of aspirin: Secondary | ICD-10-CM | POA: Insufficient documentation

## 2016-06-26 DIAGNOSIS — Z87891 Personal history of nicotine dependence: Secondary | ICD-10-CM | POA: Diagnosis not present

## 2016-06-26 DIAGNOSIS — E119 Type 2 diabetes mellitus without complications: Secondary | ICD-10-CM | POA: Insufficient documentation

## 2016-06-26 DIAGNOSIS — Z79899 Other long term (current) drug therapy: Secondary | ICD-10-CM | POA: Insufficient documentation

## 2016-06-26 DIAGNOSIS — R197 Diarrhea, unspecified: Secondary | ICD-10-CM

## 2016-06-26 DIAGNOSIS — I10 Essential (primary) hypertension: Secondary | ICD-10-CM | POA: Insufficient documentation

## 2016-06-26 DIAGNOSIS — R1032 Left lower quadrant pain: Secondary | ICD-10-CM

## 2016-06-26 DIAGNOSIS — Z7984 Long term (current) use of oral hypoglycemic drugs: Secondary | ICD-10-CM | POA: Insufficient documentation

## 2016-06-26 DIAGNOSIS — J45909 Unspecified asthma, uncomplicated: Secondary | ICD-10-CM | POA: Diagnosis not present

## 2016-06-26 DIAGNOSIS — G8929 Other chronic pain: Secondary | ICD-10-CM | POA: Diagnosis not present

## 2016-06-26 LAB — URINALYSIS, COMPLETE (UACMP) WITH MICROSCOPIC
BILIRUBIN URINE: NEGATIVE
Bacteria, UA: NONE SEEN
Hgb urine dipstick: NEGATIVE
Ketones, ur: NEGATIVE mg/dL
Leukocytes, UA: NEGATIVE
NITRITE: NEGATIVE
PH: 5 (ref 5.0–8.0)
Protein, ur: NEGATIVE mg/dL
RBC / HPF: NONE SEEN RBC/hpf (ref 0–5)
Specific Gravity, Urine: 1.013 (ref 1.005–1.030)
Squamous Epithelial / LPF: NONE SEEN

## 2016-06-26 LAB — CBC
HCT: 35.3 % — ABNORMAL LOW (ref 40.0–52.0)
Hemoglobin: 12.3 g/dL — ABNORMAL LOW (ref 13.0–18.0)
MCH: 29.7 pg (ref 26.0–34.0)
MCHC: 34.7 g/dL (ref 32.0–36.0)
MCV: 85.5 fL (ref 80.0–100.0)
Platelets: 270 10*3/uL (ref 150–440)
RBC: 4.13 MIL/uL — AB (ref 4.40–5.90)
RDW: 14.6 % — ABNORMAL HIGH (ref 11.5–14.5)
WBC: 8.6 10*3/uL (ref 3.8–10.6)

## 2016-06-26 LAB — BASIC METABOLIC PANEL
Anion gap: 9 (ref 5–15)
BUN: 12 mg/dL (ref 6–20)
CHLORIDE: 101 mmol/L (ref 101–111)
CO2: 27 mmol/L (ref 22–32)
CREATININE: 1.04 mg/dL (ref 0.61–1.24)
Calcium: 9.6 mg/dL (ref 8.9–10.3)
GFR calc non Af Amer: >60 mL/min (ref >60)
Glucose, Bld: 283 mg/dL — ABNORMAL HIGH (ref 65–99)
POTASSIUM: 4.1 mmol/L (ref 3.5–5.1)
SODIUM: 137 mmol/L (ref 135–145)

## 2016-06-26 LAB — GLUCOSE, CAPILLARY: Glucose-Capillary: 284 mg/dL — ABNORMAL HIGH (ref 65–99)

## 2016-06-26 MED ORDER — METRONIDAZOLE 500 MG PO TABS: 500.0000 mg | ORAL_TABLET | Freq: Three times a day (TID) | ORAL | 0 refills | Status: AC

## 2016-06-26 MED ORDER — METRONIDAZOLE 500 MG PO TABS
500.0000 mg | ORAL_TABLET | Freq: Once | ORAL | Status: AC
  Administered 2016-06-26: 500 mg via ORAL
  Filled 2016-06-26: qty 1

## 2016-06-26 MED ORDER — CIPROFLOXACIN HCL 500 MG PO TABS: 500.0000 mg | ORAL_TABLET | Freq: Two times a day (BID) | ORAL | 0 refills | Status: AC

## 2016-06-26 MED ORDER — CIPROFLOXACIN HCL 500 MG PO TABS
500.0000 mg | ORAL_TABLET | Freq: Once | ORAL | Status: AC
  Administered 2016-06-26: 500 mg via ORAL
  Filled 2016-06-26: qty 1

## 2016-06-26 MED ORDER — HYDROCODONE-ACETAMINOPHEN 5-325 MG PO TABS
1.0000 | ORAL_TABLET | Freq: Once | ORAL | Status: AC
  Administered 2016-06-26: 1 via ORAL
  Filled 2016-06-26: qty 1

## 2016-06-26 NOTE — ED Notes (Signed)
Pt waiting on sister to come pick him up

## 2016-06-26 NOTE — ED Provider Notes (Addendum)
[facility] Emergency Department Provider Note    First MD Initiated Contact with Patient 06/26/16 0630     (approximate)  I have reviewed the triage vital signs and the nursing notes.   HISTORY  Chief Complaint Dizziness and Emesis    HPI Matthew Montes is a 56 y.o. male who presents for persistent nausea vomiting diarrhea and left lower abdominal pain. Patient with recent visits to ER for similar. Had CT imaging done which showed evidence of colitis and proctitis. As symptoms or other wise minor was not started on any antibiotics. Patient presents today with worsening of symptoms. Denies any blood in his diarrhea. No melanotic diarrhea. No significant pain with defecation. Denies any rectal pain. Pain is isolated to the left lower quadrant. No measured fevers. No recent antibiotic use.   Past Medical History:  Diagnosis Date  . ADHD (attention deficit hyperactivity disorder)   . Asthma   . Cardiomegaly   . Chronic back pain   . Chronic neck pain   . COPD (chronic obstructive pulmonary disease) (HCC)   . Diabetes mellitus without complication (HCC)   . Hypertension   . Liver failure (HCC)   . Renal disorder   . Renal insufficiency   . Schizophrenia (HCC)    Family History  Problem Relation Age of Onset  . Heart attack Mother   . Heart disease Father   . Diabetes Father    Past Surgical History:  Procedure Laterality Date  . BACK SURGERY    . NECK SURGERY    . SPINAL FUSION     Patient Active Problem List   Diagnosis Date Noted  . Cellulitis of submandibular region 10/10/2015      Prior to Admission medications   Medication Sig Start Date End Date Taking? Authorizing Provider  amoxicillin-clavulanate (AUGMENTIN) 875-125 MG tablet Take 1 tablet by mouth 2 (two) times daily. Patient not taking: Reported on 11/01/2015 10/11/15   Houston Siren, MD  aspirin EC 81 MG tablet Take 81 mg by mouth every morning.    [provider]    atorvastatin (LIPITOR) 80 MG tablet Take 80 mg by mouth every morning. 10/08/15 10/07/16  [provider]  ciprofloxacin (CIPRO) 500 MG tablet Take 1 tablet (500 mg total) by mouth 2 (two) times daily. 06/26/16 07/06/16  Willy Eddy, MD  doxepin (SINEQUAN) 50 MG capsule Take 50 mg by mouth at bedtime. 09/02/15   [provider]  DULoxetine (CYMBALTA) 60 MG capsule Take 60 mg by mouth 2 (two) times daily. 08/14/15   [provider]  esomeprazole (NEXIUM) 40 MG capsule Take 40 mg by mouth daily before breakfast. 10/07/15   [provider]  glipiZIDE (GLUCOTROL) 5 MG tablet Take 5 mg by mouth 2 (two) times daily. 09/16/15   [provider]  LANTUS SOLOSTAR 100 UNIT/ML Solostar Pen Inject 60 Units into the skin at bedtime. 10/07/15   [provider]  lisinopril (PRINIVIL,ZESTRIL) 5 MG tablet Take 5 mg by mouth every morning.  09/18/15   [provider]  loperamide (IMODIUM A-D) 2 MG tablet Take 1 tablet (2 mg total) by mouth 4 (four) times daily as needed for diarrhea or loose stools. 06/19/16   Rockne Menghini, MD  LYRICA 100 MG capsule Take 100 mg by mouth 2 (two) times daily.  09/10/15   [provider]  metFORMIN (GLUCOPHAGE) 1000 MG tablet Take 1,000 mg by mouth 2 (two) times daily with a meal. 10/07/15   [provider]  metoprolol succinate (TOPROL-XL) 50 MG 24 hr tablet Take 50 mg by mouth every morning.  10/07/15   [provider]  metroNIDAZOLE (FLAGYL) 500 MG tablet Take 1 tablet (500 mg total) by mouth 3 (three) times daily. 06/26/16 07/06/16  Willy Eddy, MD  OLANZapine (ZYPREXA) 7.5 MG tablet Take 7.5 mg by mouth at bedtime.  08/14/15   [provider]  ondansetron (ZOFRAN ODT) 4 MG disintegrating tablet Take 1 tablet (4 mg total) by mouth every 8 (eight) hours as needed for nausea or vomiting. 06/19/16   Rockne Menghini, MD  predniSONE (DELTASONE) 10 MG tablet Label  & dispense according  to the schedule below. 5 Pills PO for 1 day then, 4 Pills PO for 1 day, 3 Pills PO for 1 day, 2 Pills PO for 1 day, 1 Pill PO for 1 days then STOP. Patient not taking: Reported on 11/01/2015 10/11/15   Houston Siren, MD  SYMBICORT 160-4.5 MCG/ACT inhaler Inhale 2 puffs into the lungs 2 (two) times daily. 10/08/15   [provider]  tamsulosin (FLOMAX) 0.4 MG CAPS capsule Take 0.4 mg by mouth every morning.  10/07/15   [provider]  traMADol (ULTRAM) 50 MG tablet Take 50 mg by mouth every 8 (eight) hours as needed for pain. 09/10/15   [provider]  triamcinolone ointment (KENALOG) 0.1 % Apply 1 application topically 2 (two) times daily. 10/07/15   [provider]  VENTOLIN HFA 108 (90 Base) MCG/ACT inhaler Inhale 2 puffs into the lungs every 4 (four) hours as needed for shortness of breath or wheezing. 10/07/15   [provider]  VOLTAREN 1 % GEL Apply 2 g topically 4 (four) times daily. 09/10/15   [provider]    Allergies Patient has no known allergies.    Social History Social History  Substance Use Topics  . Smoking status: Former Games developer  . Smokeless tobacco: Current User    Types: Chew  . Alcohol use Yes     Comment: 3 drinks/month.     Review of Systems Patient denies headaches, rhinorrhea, blurry vision, numbness, shortness of breath, chest pain, edema, cough, abdominal pain, nausea, vomiting, diarrhea, dysuria, fevers, rashes or hallucinations unless otherwise stated above in HPI. ____________________________________________   PHYSICAL EXAM:  VITAL SIGNS: Vitals:   06/26/16 0631 06/26/16 0700  BP: (!) 149/104 139/75  Pulse: 78 79  Resp: (!) 22 11  Temp:      Constitutional: Alert and oriented. in no acute distress. Eyes: Conjunctivae are normal. PERRL. EOMI. Head: Atraumatic. Nose: No congestion/rhinnorhea. Mouth/Throat: Mucous membranes are moist.  Oropharynx non-erythematous. Neck: No stridor. Painless  ROM. No cervical spine tenderness to palpation Hematological/Lymphatic/Immunilogical: No cervical lymphadenopathy. Cardiovascular: Normal rate, regular rhythm. Grossly normal heart sounds.  Good peripheral circulation. Respiratory: Normal respiratory effort.  No retractions. Lungs CTAB. Gastrointestinal: Soft, mild ttp of llq without rebound or guarding. No distention. No abdominal bruits. No CVA tenderness. Musculoskeletal: No lower extremity tenderness nor edema.  No joint effusions. Neurologic:  Normal speech and language. No gross focal neurologic deficits are appreciated. No gait instability. Skin:  Skin is warm, dry and intact. No rash noted. Psychiatric: Mood and affect are normal. Speech and behavior are normal.  ____________________________________________   LABS (all labs ordered are listed, but only abnormal results are displayed)  Results for orders placed or performed during the hospital encounter of 06/26/16 (from the past 24 hour(s))  Basic metabolic panel     Status: Abnormal  Collection Time: 06/26/16  4:51 AM  Result Value Ref Range   Sodium 137 135 - 145 mmol/L   Potassium 4.1 3.5 - 5.1 mmol/L   Chloride 101 101 - 111 mmol/L   CO2 27 22 - 32 mmol/L   Glucose, Bld 283 (H) 65 - 99 mg/dL   BUN 12 6 - 20 mg/dL   Creatinine, Ser 6.211.04 0.61 - 1.24 mg/dL   Calcium 9.6 8.9 - 30.810.3 mg/dL   GFR calc non Af Amer >60 >60 mL/min   GFR calc Af Amer >60 >60 mL/min   Anion gap 9 5 - 15  CBC     Status: Abnormal   Collection Time: 06/26/16  4:51 AM  Result Value Ref Range   WBC 8.6 3.8 - 10.6 K/uL   RBC 4.13 (L) 4.40 - 5.90 MIL/uL   Hemoglobin 12.3 (L) 13.0 - 18.0 g/dL   HCT 65.735.3 (L) 84.640.0 - 96.252.0 %   MCV 85.5 80.0 - 100.0 fL   MCH 29.7 26.0 - 34.0 pg   MCHC 34.7 32.0 - 36.0 g/dL   RDW 95.214.6 (H) 84.111.5 - 32.414.5 %   Platelets 270 150 - 440 K/uL  Urinalysis, Complete w Microscopic     Status: Abnormal   Collection Time: 06/26/16  4:51 AM  Result Value Ref Range   Color, Urine  YELLOW (A) YELLOW   APPearance CLEAR (A) CLEAR   Specific Gravity, Urine 1.013 1.005 - 1.030   pH 5.0 5.0 - 8.0   Glucose, UA >=500 (A) NEGATIVE mg/dL   Hgb urine dipstick NEGATIVE NEGATIVE   Bilirubin Urine NEGATIVE NEGATIVE   Ketones, ur NEGATIVE NEGATIVE mg/dL   Protein, ur NEGATIVE NEGATIVE mg/dL   Nitrite NEGATIVE NEGATIVE   Leukocytes, UA NEGATIVE NEGATIVE   RBC / HPF NONE SEEN 0 - 5 RBC/hpf   WBC, UA 0-5 0 - 5 WBC/hpf   Bacteria, UA NONE SEEN NONE SEEN   Squamous Epithelial / LPF NONE SEEN NONE SEEN   Mucous PRESENT   Glucose, capillary     Status: Abnormal   Collection Time: 06/26/16  6:20 AM  Result Value Ref Range   Glucose-Capillary 284 (H) 65 - 99 mg/dL   Comment 1 Document in Chart    ____________________________________________  EKG ED ECG REPORT I, Willy EddyPatrick Ambyr Qadri, the attending physician, personally viewed and interpreted this ECG.   Date: 06/26/2016  EKG Time: 4:55  Rate: 85  Rhythm: normal EKG, normal sinus rhythm, unchanged from previous tracings  Axis: normal  Intervals:normal intervals  ST&T Change: no STEMI or depressions  ____________________________________________  RADIOLOGY   ____________________________________________   PROCEDURES  Procedure(s) performed:  Procedures    Critical Care performed: no ____________________________________________   INITIAL IMPRESSION / ASSESSMENT AND PLAN / ED COURSE  Pertinent labs & imaging results that were available during my care of the patient were reviewed by me and considered in my medical decision making (see chart for details).  DDX: colitis, proctitis, enteritis, gastritis, diverticulitis, dehydration  Matthew Montes is a 56 y.o. who presents to the ED with above complaints. Patient is in no acute distress. Symptoms similar to previous visit. Patient is afebrile, not tachycardic, well-perfused and normotensive. Blood work shows no evidence of acute leukocytosis. Based on his abdominal  exam and do not feel that repeat CT imaging is clinically indicated. Given the duration of symptoms however do feel that antibiotics would be of benefit. Not clinically consistent with abscess. Patient able to ambulate with a steady gait. He is  tolerating oral hydration. No evidence of significant dehydration.  Have discussed with the patient and available family all diagnostics and treatments performed thus far and all questions were answered to the best of my ability. The patient demonstrates understanding and agreement with plan.       ____________________________________________   FINAL CLINICAL IMPRESSION(S) / ED DIAGNOSES  Final diagnoses:  Left lower quadrant pain  Nausea vomiting and diarrhea      NEW MEDICATIONS STARTED DURING THIS VISIT:  New Prescriptions   CIPROFLOXACIN (CIPRO) 500 MG TABLET    Take 1 tablet (500 mg total) by mouth 2 (two) times daily.   METRONIDAZOLE (FLAGYL) 500 MG TABLET    Take 1 tablet (500 mg total) by mouth 3 (three) times daily.     Note:  This document was prepared using Dragon voice recognition software and may include unintentional dictation errors.    Willy Eddy, MD 06/26/16 1610    Willy Eddy, MD 06/26/16 [phone]

## 2016-06-26 NOTE — ED Triage Notes (Signed)
Pt presents to ED via POV with c/o N/V/D and weakness. Pt was seen last Friday here for the same. Pt is drinking from water bottle in triage and is in NAD at this time.

## 2016-06-26 NOTE — ED Notes (Signed)
Patient urged to void 

## 2016-06-26 NOTE — Discharge Instructions (Signed)

## 2016-07-02 DIAGNOSIS — Z79899 Other long term (current) drug therapy: Secondary | ICD-10-CM | POA: Insufficient documentation

## 2016-07-02 DIAGNOSIS — Z794 Long term (current) use of insulin: Secondary | ICD-10-CM | POA: Diagnosis not present

## 2016-07-02 DIAGNOSIS — J449 Chronic obstructive pulmonary disease, unspecified: Secondary | ICD-10-CM | POA: Insufficient documentation

## 2016-07-02 DIAGNOSIS — I10 Essential (primary) hypertension: Secondary | ICD-10-CM | POA: Insufficient documentation

## 2016-07-02 DIAGNOSIS — J45909 Unspecified asthma, uncomplicated: Secondary | ICD-10-CM | POA: Diagnosis not present

## 2016-07-02 DIAGNOSIS — R42 Dizziness and giddiness: Secondary | ICD-10-CM | POA: Insufficient documentation

## 2016-07-02 DIAGNOSIS — E119 Type 2 diabetes mellitus without complications: Secondary | ICD-10-CM | POA: Diagnosis not present

## 2016-07-02 DIAGNOSIS — Z7984 Long term (current) use of oral hypoglycemic drugs: Secondary | ICD-10-CM | POA: Insufficient documentation

## 2016-07-02 DIAGNOSIS — Z87891 Personal history of nicotine dependence: Secondary | ICD-10-CM | POA: Diagnosis not present

## 2016-07-02 LAB — CBC
HEMATOCRIT: 35 % — AB (ref 40.0–52.0)
Hemoglobin: 12 g/dL — ABNORMAL LOW (ref 13.0–18.0)
MCH: 28.7 pg (ref 26.0–34.0)
MCHC: 34.4 g/dL (ref 32.0–36.0)
MCV: 83.5 fL (ref 80.0–100.0)
Platelets: 304 10*3/uL (ref 150–440)
RBC: 4.19 MIL/uL — ABNORMAL LOW (ref 4.40–5.90)
RDW: 14.9 % — AB (ref 11.5–14.5)
WBC: 10.4 10*3/uL (ref 3.8–10.6)

## 2016-07-02 LAB — BASIC METABOLIC PANEL
Anion gap: 7 (ref 5–15)
BUN: 15 mg/dL (ref 6–20)
CHLORIDE: 102 mmol/L (ref 101–111)
CO2: 29 mmol/L (ref 22–32)
Calcium: 9.8 mg/dL (ref 8.9–10.3)
Creatinine, Ser: 1.33 mg/dL — ABNORMAL HIGH (ref 0.61–1.24)
GFR calc Af Amer: >60 mL/min (ref >60)
GFR calc non Af Amer: 59 mL/min — ABNORMAL LOW (ref >60)
Glucose, Bld: 208 mg/dL — ABNORMAL HIGH (ref 65–99)
Potassium: 4.7 mmol/L (ref 3.5–5.1)
SODIUM: 138 mmol/L (ref 135–145)

## 2016-07-02 NOTE — ED Triage Notes (Signed)
Patient reports dizziness for the past 2 weeks.  Reports started antibiotics for colitis last week.

## 2016-07-03 ENCOUNTER — Emergency Department
Admission: EM | Admit: 2016-07-03 | Discharge: 2016-07-03 | Disposition: A | Discharge status: Home / Self Care | Payer: Medicare HMO | Attending: Emergency Medicine | Admitting: Emergency Medicine

## 2016-07-03 ENCOUNTER — Emergency Department: Payer: Medicare HMO

## 2016-07-03 DIAGNOSIS — R42 Dizziness and giddiness: Secondary | ICD-10-CM | POA: Diagnosis not present

## 2016-07-03 LAB — URINALYSIS, COMPLETE (UACMP) WITH MICROSCOPIC
BACTERIA UA: NONE SEEN
BILIRUBIN URINE: NEGATIVE
Glucose, UA: >=500 mg/dL — AB
Hgb urine dipstick: NEGATIVE
KETONES UR: NEGATIVE mg/dL
LEUKOCYTES UA: NEGATIVE
Nitrite: NEGATIVE
Protein, ur: NEGATIVE mg/dL
RBC / HPF: NONE SEEN RBC/hpf (ref 0–5)
SQUAMOUS EPITHELIAL / LPF: NONE SEEN
Specific Gravity, Urine: 1.006 (ref 1.005–1.030)
pH: 6 (ref 5.0–8.0)

## 2016-07-03 LAB — TROPONIN I

## 2016-07-03 MED ORDER — HYDROCODONE-ACETAMINOPHEN 5-325 MG PO TABS
1.0000 | ORAL_TABLET | Freq: Once | ORAL | Status: AC
  Administered 2016-07-03: 1 via ORAL

## 2016-07-03 MED ORDER — MECLIZINE HCL 25 MG PO TABS
25.0000 mg | ORAL_TABLET | Freq: Once | ORAL | Status: AC
  Administered 2016-07-03: 25 mg via ORAL
  Filled 2016-07-03: qty 1

## 2016-07-03 MED ORDER — ONDANSETRON 4 MG PO TBDP
4.0000 mg | ORAL_TABLET | Freq: Once | ORAL | Status: AC
  Administered 2016-07-03: 4 mg via ORAL
  Filled 2016-07-03: qty 1

## 2016-07-03 MED ORDER — ONDANSETRON 4 MG PO TBDP: 4.0000 mg | ORAL_TABLET | Freq: Three times a day (TID) | ORAL | 0 refills | Status: DC | PRN

## 2016-07-03 MED ORDER — HYDROCODONE-ACETAMINOPHEN 5-325 MG PO TABS: 1.0000 | ORAL_TABLET | Freq: Four times a day (QID) | ORAL | 0 refills | Status: DC | PRN

## 2016-07-03 MED ORDER — HYDROCODONE-ACETAMINOPHEN 5-325 MG PO TABS
ORAL_TABLET | ORAL | Status: AC
  Administered 2016-07-03: 1 via ORAL
  Filled 2016-07-03: qty 1

## 2016-07-03 MED ORDER — MECLIZINE HCL 25 MG PO TABS: 25.0000 mg | ORAL_TABLET | Freq: Three times a day (TID) | ORAL | 0 refills | Status: DC | PRN

## 2016-07-03 NOTE — Discharge Instructions (Signed)
1.  You may take medicines as needed for dizziness and nausea (Meclizine/Zofran #20). °2.  Return to the ER for worsening symptoms, persistent vomiting, difficulty breathing or other concerns. °

## 2016-07-03 NOTE — ED Notes (Signed)
Patient transported to CT 

## 2016-07-03 NOTE — ED Provider Notes (Signed)
[facility] Emergency Department Provider Note   ____________________________________________   First MD Initiated Contact with Patient 07/03/16 0301     (approximate)  I have reviewed the triage vital signs and the nursing notes.   HISTORY  Chief Complaint Dizziness    HPI Matthew Montes is a 56 y.o. male who presents to the ED from home with a chief complaint of dizziness. Patient reports a two-week history of dizziness which he describes as spinning sensation as well as lightheadedness.States he was seen here as well as [facility] for similar. He was started on antibiotics for colitis last week. Denies associated headache, vision changes, neck pain, fever, chills, chest pain, shortness of breath, nausea, vomiting, diarrhea. He is still having some abdominal discomfort from colitis. Denies recent travel or trauma. Nothing makes his dizziness better. Movement and positioning make his dizziness worse. Denies slurred speech, extremity weakness, numbness or tingling.   Past Medical History:  Diagnosis Date  . ADHD (attention deficit hyperactivity disorder)   . Asthma   . Cardiomegaly   . Chronic back pain   . Chronic neck pain   . COPD (chronic obstructive pulmonary disease) (HCC)   . Diabetes mellitus without complication (HCC)   . Hypertension   . Liver failure (HCC)   . Renal disorder   . Renal insufficiency   . Schizophrenia [facility])     Patient Active Problem List   Diagnosis Date Noted  . Cellulitis of submandibular region 10/10/2015    Past Surgical History:  Procedure Laterality Date  . BACK SURGERY    . NECK SURGERY    . SPINAL FUSION      Prior to Admission medications   Medication Sig Start Date End Date Taking? Authorizing Provider  amoxicillin-clavulanate (AUGMENTIN) 875-125 MG tablet Take 1 tablet by mouth 2 (two) times daily. Patient not taking: Reported on 11/01/2015 10/11/15   Houston Siren, MD  aspirin EC 81 MG tablet Take 81 mg  by mouth every morning.    [provider]  atorvastatin (LIPITOR) 80 MG tablet Take 80 mg by mouth every morning. 10/08/15 10/07/16  [provider]  ciprofloxacin (CIPRO) 500 MG tablet Take 1 tablet (500 mg total) by mouth 2 (two) times daily. 06/26/16 07/06/16  Willy Eddy, MD  doxepin (SINEQUAN) 50 MG capsule Take 50 mg by mouth at bedtime. 09/02/15   [provider]  DULoxetine (CYMBALTA) 60 MG capsule Take 60 mg by mouth 2 (two) times daily. 08/14/15   [provider]  esomeprazole (NEXIUM) 40 MG capsule Take 40 mg by mouth daily before breakfast. 10/07/15   [provider]  glipiZIDE (GLUCOTROL) 5 MG tablet Take 5 mg by mouth 2 (two) times daily. 09/16/15   [provider]  LANTUS SOLOSTAR 100 UNIT/ML Solostar Pen Inject 60 Units into the skin at bedtime. 10/07/15   [provider]  lisinopril (PRINIVIL,ZESTRIL) 5 MG tablet Take 5 mg by mouth every morning.  09/18/15   [provider]  loperamide (IMODIUM A-D) 2 MG tablet Take 1 tablet (2 mg total) by mouth 4 (four) times daily as needed for diarrhea or loose stools. 06/19/16   Rockne Menghini, MD  LYRICA 100 MG capsule Take 100 mg by mouth 2 (two) times daily.  09/10/15   [provider]  meclizine (ANTIVERT) 25 MG tablet Take 1 tablet (25 mg total) by mouth 3 (three) times daily as needed for dizziness or nausea. 07/03/16   Irean Hong, MD  metFORMIN (  GLUCOPHAGE) 1000 MG tablet Take 1,000 mg by mouth 2 (two) times daily with a meal. 10/07/15   [provider]  metoprolol succinate (TOPROL-XL) 50 MG 24 hr tablet Take 50 mg by mouth every morning.  10/07/15   [provider]  metroNIDAZOLE (FLAGYL) 500 MG tablet Take 1 tablet (500 mg total) by mouth 3 (three) times daily. 06/26/16 07/06/16  Willy Eddy, MD  OLANZapine (ZYPREXA) 7.5 MG tablet Take 7.5 mg by mouth at bedtime.  08/14/15   [provider]  ondansetron (ZOFRAN ODT) 4 MG  disintegrating tablet Take 1 tablet (4 mg total) by mouth every 8 (eight) hours as needed for nausea or vomiting. 07/03/16   Irean Hong, MD  predniSONE (DELTASONE) 10 MG tablet Label  & dispense according to the schedule below. 5 Pills PO for 1 day then, 4 Pills PO for 1 day, 3 Pills PO for 1 day, 2 Pills PO for 1 day, 1 Pill PO for 1 days then STOP. Patient not taking: Reported on 11/01/2015 10/11/15   Houston Siren, MD  SYMBICORT 160-4.5 MCG/ACT inhaler Inhale 2 puffs into the lungs 2 (two) times daily. 10/08/15   [provider]  tamsulosin (FLOMAX) 0.4 MG CAPS capsule Take 0.4 mg by mouth every morning.  10/07/15   [provider]  traMADol (ULTRAM) 50 MG tablet Take 50 mg by mouth every 8 (eight) hours as needed for pain. 09/10/15   [provider]  triamcinolone ointment (KENALOG) 0.1 % Apply 1 application topically 2 (two) times daily. 10/07/15   [provider]  VENTOLIN HFA 108 (90 Base) MCG/ACT inhaler Inhale 2 puffs into the lungs every 4 (four) hours as needed for shortness of breath or wheezing. 10/07/15   [provider]  VOLTAREN 1 % GEL Apply 2 g topically 4 (four) times daily. 09/10/15   [provider]    Allergies Patient has no known allergies.  Family History  Problem Relation Age of Onset  . Heart attack Mother   . Heart disease Father   . Diabetes Father     Social History Social History  Substance Use Topics  . Smoking status: Former Games developer  . Smokeless tobacco: Current User    Types: Chew  . Alcohol use Yes     Comment: 3 drinks/month.     Review of Systems  Constitutional: No fever/chills. Eyes: No visual changes. ENT: No sore throat. Cardiovascular: Denies chest pain. Respiratory: Denies shortness of breath. Gastrointestinal: No abdominal pain.  No nausea, no vomiting.  No diarrhea.  No constipation. Genitourinary: Negative for dysuria. Musculoskeletal: Negative for back pain. Skin: Negative for  rash. Neurological: Positive for dizziness. Negative for headaches, focal weakness or numbness.   ____________________________________________   PHYSICAL EXAM:  VITAL SIGNS: ED Triage Vitals  Enc Vitals Group     BP 07/02/16 2324 (!) 151/76     Pulse Rate 07/02/16 2324 71     Resp 07/02/16 2324 18     Temp 07/02/16 2324 99 F (37.2 C)     Temp Source 07/02/16 2324 Oral     SpO2 07/02/16 2324 99 %     Weight --      Height --      Head Circumference --      Peak Flow --      Pain Score 07/02/16 2318 8     Pain Loc --      Pain Edu? --      Excl. in GC? --  Constitutional: Alert and oriented. Well appearing and in no acute distress. Eyes: Conjunctivae are normal. PERRL. EOMI. Head: Atraumatic. Ears: Within normal limits Nose: No congestion/rhinnorhea. Mouth/Throat: Mucous membranes are moist.  Oropharynx non-erythematous. Neck: No stridor.  No carotid bruits. Cardiovascular: Normal rate, regular rhythm. Grossly normal heart sounds.  Good peripheral circulation. Respiratory: Normal respiratory effort.  No retractions. Lungs CTAB. Gastrointestinal: Soft and slightly tender to palpation diffusely without rebound or guarding. No distention. No abdominal bruits. No CVA tenderness. Musculoskeletal: No lower extremity tenderness nor edema.  No joint effusions. Neurologic:  Alert and oriented 3. CN II-XII grossly intact. Normal speech and language. No gross focal neurologic deficits are appreciated. No gait instability. Skin:  Skin is warm, dry and intact. No rash noted. Psychiatric: Mood and affect are normal. Speech and behavior are normal.  ____________________________________________   LABS (all labs ordered are listed, but only abnormal results are displayed)  Labs Reviewed  BASIC METABOLIC PANEL - Abnormal; Notable for the following:       Result Value   Glucose, Bld 208 (*)    Creatinine, Ser 1.33 (*)    GFR calc non Af Amer 59 (*)    All other components  within normal limits  CBC - Abnormal; Notable for the following:    RBC 4.19 (*)    Hemoglobin 12.0 (*)    HCT 35.0 (*)    RDW 14.9 (*)    All other components within normal limits  URINALYSIS, COMPLETE (UACMP) WITH MICROSCOPIC - Abnormal; Notable for the following:    Color, Urine STRAW (*)    APPearance CLEAR (*)    Glucose, UA >=500 (*)    All other components within normal limits  TROPONIN I   ____________________________________________  EKG  ED ECG REPORT I, SUNG,JADE J, the attending physician, personally viewed and interpreted this ECG.   Date: 07/03/2016  EKG Time: 2320  Rate: 71  Rhythm: normal EKG, normal sinus rhythm  Axis: Normal  Intervals:none  ST&T Change: Nonspecific  ____________________________________________  RADIOLOGY  CT head and interpreted per Dr. Chase Picket: Normal head CT. ____________________________________________   PROCEDURES  Procedure(s) performed: None  Procedures  Critical Care performed: No  ____________________________________________   INITIAL IMPRESSION / ASSESSMENT AND PLAN / ED COURSE  Pertinent labs & imaging results that were available during my care of the patient were reviewed by me and considered in my medical decision making (see chart for details).  56 year old male who presents with a two-week history of dizziness. Laboratory and urinalysis results remarkable for mild hyperglycemia and mild renal insufficiency. Will obtain CT head, orthostatics, administer meclizine and reassess.  Clinical Course as of Jul 04 739  Wynelle Link Jul 03, 2016  [id number] Delay secondary to other critical patients. Patient soundly asleep in no acute distress. Dizziness greatly improved. He was not orthostatic. Updated him of negative CT head results. Limited Norco prescription given for abdominal pain secondary to colitis. He is to finish his antibiotics as previously directed. Tells me he is scheduled for hernia surgery tomorrow. Strict return  precautions given. Patient verbalizes understanding and agrees with plan of care.  [JS]    Clinical Course User Index [JS] Irean Hong, MD     ____________________________________________   FINAL CLINICAL IMPRESSION(S) / ED DIAGNOSES  Final diagnoses:  Dizziness      NEW MEDICATIONS STARTED DURING THIS VISIT:  New Prescriptions   MECLIZINE (ANTIVERT) 25 MG TABLET    Take 1 tablet (25 mg total) by mouth 3 (three) times daily  as needed for dizziness or nausea.   ONDANSETRON (ZOFRAN ODT) 4 MG DISINTEGRATING TABLET    Take 1 tablet (4 mg total) by mouth every 8 (eight) hours as needed for nausea or vomiting.     Note:  This document was prepared using Dragon voice recognition software and may include unintentional dictation errors.    Irean HongSung, Jade J, MD 07/03/16 [phone]

## 2018-03-12 ENCOUNTER — Encounter (HOSPITAL_COMMUNITY): Payer: Self-pay

## 2018-03-12 ENCOUNTER — Emergency Department (HOSPITAL_COMMUNITY)
Admission: EM | Admit: 2018-03-12 | Discharge: 2018-03-13 | Disposition: A | Discharge status: Home / Self Care | Payer: Medicare Other | Attending: Emergency Medicine | Admitting: Emergency Medicine

## 2018-03-12 DIAGNOSIS — N189 Chronic kidney disease, unspecified: Secondary | ICD-10-CM | POA: Insufficient documentation

## 2018-03-12 DIAGNOSIS — F17290 Nicotine dependence, other tobacco product, uncomplicated: Secondary | ICD-10-CM | POA: Insufficient documentation

## 2018-03-12 DIAGNOSIS — R531 Weakness: Secondary | ICD-10-CM | POA: Diagnosis not present

## 2018-03-12 DIAGNOSIS — Z7982 Long term (current) use of aspirin: Secondary | ICD-10-CM | POA: Insufficient documentation

## 2018-03-12 DIAGNOSIS — E162 Hypoglycemia, unspecified: Secondary | ICD-10-CM

## 2018-03-12 DIAGNOSIS — Z794 Long term (current) use of insulin: Secondary | ICD-10-CM | POA: Diagnosis not present

## 2018-03-12 DIAGNOSIS — E1122 Type 2 diabetes mellitus with diabetic chronic kidney disease: Secondary | ICD-10-CM | POA: Insufficient documentation

## 2018-03-12 DIAGNOSIS — E11649 Type 2 diabetes mellitus with hypoglycemia without coma: Secondary | ICD-10-CM | POA: Diagnosis not present

## 2018-03-12 DIAGNOSIS — I129 Hypertensive chronic kidney disease with stage 1 through stage 4 chronic kidney disease, or unspecified chronic kidney disease: Secondary | ICD-10-CM | POA: Insufficient documentation

## 2018-03-12 DIAGNOSIS — R1084 Generalized abdominal pain: Secondary | ICD-10-CM | POA: Insufficient documentation

## 2018-03-12 DIAGNOSIS — J449 Chronic obstructive pulmonary disease, unspecified: Secondary | ICD-10-CM | POA: Insufficient documentation

## 2018-03-12 LAB — URINALYSIS, ROUTINE W REFLEX MICROSCOPIC
Bilirubin Urine: NEGATIVE
Glucose, UA: NEGATIVE mg/dL
Hgb urine dipstick: NEGATIVE
Ketones, ur: NEGATIVE mg/dL
Leukocytes, UA: NEGATIVE
Nitrite: NEGATIVE
Protein, ur: NEGATIVE mg/dL
SPECIFIC GRAVITY, URINE: 1.01 (ref 1.005–1.030)
pH: 5 (ref 5.0–8.0)

## 2018-03-12 LAB — CBC
HCT: 38.3 % — ABNORMAL LOW (ref 39.0–52.0)
Hemoglobin: 13.1 g/dL (ref 13.0–17.0)
MCH: 32.3 pg (ref 26.0–34.0)
MCHC: 34.2 g/dL (ref 30.0–36.0)
MCV: 94.3 fL (ref 80.0–100.0)
Platelets: 294 10*3/uL (ref 150–400)
RBC: 4.06 MIL/uL — AB (ref 4.22–5.81)
RDW: 12.5 % (ref 11.5–15.5)
WBC: 8.1 10*3/uL (ref 4.0–10.5)
nRBC: 0 % (ref 0.0–0.2)

## 2018-03-12 LAB — COMPREHENSIVE METABOLIC PANEL
ALBUMIN: 4.1 g/dL (ref 3.5–5.0)
ALT: 34 U/L (ref 0–44)
AST: 26 U/L (ref 15–41)
Alkaline Phosphatase: 56 U/L (ref 38–126)
Anion gap: 13 (ref 5–15)
BUN: 10 mg/dL (ref 6–20)
CHLORIDE: 97 mmol/L — AB (ref 98–111)
CO2: 22 mmol/L (ref 22–32)
Calcium: 9.7 mg/dL (ref 8.9–10.3)
Creatinine, Ser: 0.87 mg/dL (ref 0.61–1.24)
GFR calc Af Amer: >60 mL/min (ref >60)
GFR calc non Af Amer: >60 mL/min (ref >60)
Glucose, Bld: 134 mg/dL — ABNORMAL HIGH (ref 70–99)
Potassium: 4.2 mmol/L (ref 3.5–5.1)
SODIUM: 132 mmol/L — AB (ref 135–145)
Total Bilirubin: 0.8 mg/dL (ref 0.3–1.2)
Total Protein: 6.7 g/dL (ref 6.5–8.1)

## 2018-03-12 LAB — CBG MONITORING, ED
Glucose-Capillary: 140 mg/dL — ABNORMAL HIGH (ref 70–99)
Glucose-Capillary: 139 mg/dL — ABNORMAL HIGH (ref 70–99)

## 2018-03-12 LAB — LIPASE, BLOOD: Lipase: 56 U/L — ABNORMAL HIGH (ref 11–51)

## 2018-03-12 NOTE — ED Triage Notes (Signed)
Pt endorses that his blood sugar has been low several times over the last 2 days. Pt states that cbg was 47 last night and he took 2 glucose tablets and ate a brownie and it was 111 this morning. Pt has also been having abd pain with nausea. VSS.

## 2018-03-12 NOTE — ED Notes (Signed)
Pt's CBG result was 139. Informed Ladona Ridgel - RN.

## 2018-03-13 ENCOUNTER — Emergency Department (HOSPITAL_COMMUNITY): Payer: Medicare Other

## 2018-03-13 DIAGNOSIS — E11649 Type 2 diabetes mellitus with hypoglycemia without coma: Secondary | ICD-10-CM | POA: Diagnosis not present

## 2018-03-13 LAB — CBG MONITORING, ED
Glucose-Capillary: 160 mg/dL — ABNORMAL HIGH (ref 70–99)
Glucose-Capillary: 170 mg/dL — ABNORMAL HIGH (ref 70–99)

## 2018-03-13 LAB — AMMONIA: Ammonia: 16 umol/L (ref 9–35)

## 2018-03-13 LAB — I-STAT TROPONIN, ED: Troponin i, poc: 0.02 ng/mL (ref 0.00–0.08)

## 2018-03-13 LAB — TROPONIN I: Troponin I: <0.03 ng/mL (ref <0.03)

## 2018-03-13 MED ORDER — SODIUM CHLORIDE 0.9 % IV BOLUS
500.0000 mL | Freq: Once | INTRAVENOUS | Status: AC
  Administered 2018-03-13: 500 mL via INTRAVENOUS

## 2018-03-13 MED ORDER — IOHEXOL 300 MG/ML  SOLN
100.0000 mL | Freq: Once | INTRAMUSCULAR | Status: AC | PRN
  Administered 2018-03-13: 100 mL via INTRAVENOUS

## 2018-03-13 NOTE — Discharge Instructions (Addendum)
Reduce your Lantus dose to 40 units at night.  Keep a close record of your blood sugar and follow-up with Dr. Hyacinth MeekerMiller this week.  Return to the ED with new or worsening symptoms.

## 2018-03-13 NOTE — ED Provider Notes (Signed)
[facility] EMERGENCY DEPARTMENT Provider Note   CSN: [id number] Arrival date & time: 03/12/18  1330     History   Chief Complaint Chief Complaint  Patient presents with  . Hypoglycemia    HPI Matthew Montes is a 58 y.o. male.  Patient with history of COPD, diabetes, hypertension, schizophrenia presenting with multiple complaints.  States his blood sugars have been low in the mornings for the past 2 days.  States they were in the 40s and 50s range in the last 2 mornings that he has had to eat sugary foods to get them back up.  This morning when he woke up his sugar was 111.  He describes feeling generally lightheaded and nauseated over the past 3 days with diffuse abdominal pain.  Abdominal pain is constant and associated with one loose bowel movement per day which is normal for him.  No fevers, chills.  No pain with urination or blood in the urine.  No chest pain or shortness of breath. States compliance with his insulin regimen which includes 60 units of Lantus, Victoza, metformin.  No recent medication changes.  The history is provided by the patient.    Past Medical History:  Diagnosis Date  . ADHD (attention deficit hyperactivity disorder)   . Asthma   . Cardiomegaly   . Chronic back pain   . Chronic neck pain   . COPD (chronic obstructive pulmonary disease) (HCC)   . Diabetes mellitus without complication (HCC)   . Hypertension   . Liver failure (HCC)   . Renal disorder   . Renal insufficiency   . Schizophrenia [facility])     Patient Active Problem List   Diagnosis Date Noted  . Cellulitis of submandibular region 10/10/2015    Past Surgical History:  Procedure Laterality Date  . BACK SURGERY    . NECK SURGERY    . SPINAL FUSION          Home Medications    Prior to Admission medications   Medication Sig Start Date End Date Taking? Authorizing Provider  amoxicillin-clavulanate (AUGMENTIN) 875-125 MG tablet Take 1 tablet by mouth 2 (two)  times daily. Patient not taking: Reported on 11/01/2015 10/11/15   Houston Siren, MD  aspirin EC 81 MG tablet Take 81 mg by mouth every morning.    [provider]  atorvastatin (LIPITOR) 80 MG tablet Take 80 mg by mouth every morning. 10/08/15 10/07/16  [provider]  doxepin (SINEQUAN) 50 MG capsule Take 50 mg by mouth at bedtime. 09/02/15   [provider]  DULoxetine (CYMBALTA) 60 MG capsule Take 60 mg by mouth 2 (two) times daily. 08/14/15   [provider]  esomeprazole (NEXIUM) 40 MG capsule Take 40 mg by mouth daily before breakfast. 10/07/15   [provider]  glipiZIDE (GLUCOTROL) 5 MG tablet Take 5 mg by mouth 2 (two) times daily. 09/16/15   [provider]  HYDROcodone-acetaminophen (NORCO) 5-325 MG tablet Take 1 tablet by mouth every 6 (six) hours as needed for moderate pain. 07/03/16   Irean Hong, MD  LANTUS SOLOSTAR 100 UNIT/ML Solostar Pen Inject 60 Units into the skin at bedtime. 10/07/15   [provider]  lisinopril (PRINIVIL,ZESTRIL) 5 MG tablet Take 5 mg by mouth every morning.  09/18/15   [provider]  loperamide (IMODIUM A-D) 2 MG tablet Take 1 tablet (2 mg total) by mouth 4 (four) times daily as needed for diarrhea or loose stools. 06/19/16  Rockne MenghiniNorman, Anne-Caroline, MD  LYRICA 100 MG capsule Take 100 mg by mouth 2 (two) times daily.  09/10/15   [provider]  meclizine (ANTIVERT) 25 MG tablet Take 1 tablet (25 mg total) by mouth 3 (three) times daily as needed for dizziness or nausea. 07/03/16   Irean HongSung, Jade J, MD  metFORMIN (GLUCOPHAGE) 1000 MG tablet Take 1,000 mg by mouth 2 (two) times daily with a meal. 10/07/15   [provider]  metoprolol succinate (TOPROL-XL) 50 MG 24 hr tablet Take 50 mg by mouth every morning.  10/07/15   [provider]  OLANZapine (ZYPREXA) 7.5 MG tablet Take 7.5 mg by mouth at bedtime.  08/14/15   [provider]  ondansetron (ZOFRAN ODT) 4 MG  disintegrating tablet Take 1 tablet (4 mg total) by mouth every 8 (eight) hours as needed for nausea or vomiting. 07/03/16   Irean HongSung, Jade J, MD  predniSONE (DELTASONE) 10 MG tablet Label  & dispense according to the schedule below. 5 Pills PO for 1 day then, 4 Pills PO for 1 day, 3 Pills PO for 1 day, 2 Pills PO for 1 day, 1 Pill PO for 1 days then STOP. Patient not taking: Reported on 11/01/2015 10/11/15   Houston SirenSainani, Vivek J, MD  SYMBICORT 160-4.5 MCG/ACT inhaler Inhale 2 puffs into the lungs 2 (two) times daily. 10/08/15   [provider]  tamsulosin (FLOMAX) 0.4 MG CAPS capsule Take 0.4 mg by mouth every morning.  10/07/15   [provider]  traMADol (ULTRAM) 50 MG tablet Take 50 mg by mouth every 8 (eight) hours as needed for pain. 09/10/15   [provider]  triamcinolone ointment (KENALOG) 0.1 % Apply 1 application topically 2 (two) times daily. 10/07/15   [provider]  VENTOLIN HFA 108 (90 Base) MCG/ACT inhaler Inhale 2 puffs into the lungs every 4 (four) hours as needed for shortness of breath or wheezing. 10/07/15   [provider]  VOLTAREN 1 % GEL Apply 2 g topically 4 (four) times daily. 09/10/15   [provider]    Family History Family History  Problem Relation Age of Onset  . Heart attack Mother   . Heart disease Father   . Diabetes Father     Social History Social History   Tobacco Use  . Smoking status: Former Games developermoker  . Smokeless tobacco: Current User    Types: Chew  Substance Use Topics  . Alcohol use: Yes    Comment: 3 drinks/month.   . Drug use: Not on file     Allergies   Patient has no known allergies.   Review of Systems Review of Systems  Constitutional: Positive for activity change and appetite change. Negative for fever.  HENT: Negative for congestion and rhinorrhea.   Respiratory: Negative for cough, chest tightness and shortness of breath.   Cardiovascular: Negative for chest pain.  Gastrointestinal:  Positive for abdominal pain and nausea. Negative for vomiting.  Genitourinary: Negative for dysuria, flank pain, hematuria, scrotal swelling and testicular pain.  Musculoskeletal: Negative for arthralgias and myalgias.  Neurological: Positive for dizziness, weakness and light-headedness. Negative for headaches.  Psychiatric/Behavioral: Negative for agitation.   all other systems are negative except as noted in the HPI and PMH.     Physical Exam Updated Vital Signs BP (!) 143/85 (BP Location: Left Arm)   Pulse 78   Temp 97.7 F (36.5 C) (Oral)   Resp 18   SpO2 100%   Physical Exam Vitals signs and  nursing note reviewed.  Constitutional:      General: He is not in acute distress.    Appearance: He is well-developed.     Comments: Disheveled  HENT:     Head: Normocephalic and atraumatic.     Mouth/Throat:     Mouth: Mucous membranes are dry.     Pharynx: No oropharyngeal exudate.  Eyes:     Conjunctiva/sclera: Conjunctivae normal.     Pupils: Pupils are equal, round, and reactive to light.  Neck:     Musculoskeletal: Normal range of motion and neck supple.     Comments: No meningismus. Cardiovascular:     Rate and Rhythm: Normal rate and regular rhythm.     Heart sounds: Normal heart sounds. No murmur.  Pulmonary:     Effort: Pulmonary effort is normal. No respiratory distress.     Breath sounds: Normal breath sounds.  Abdominal:     General: There is distension.     Palpations: Abdomen is soft.     Tenderness: There is abdominal tenderness. There is no guarding or rebound.     Comments: Distended abdomen, diffusely tender with voluntary guarding throughout  Genitourinary:    Comments: No testicular tenderness Musculoskeletal: Normal range of motion.        General: No tenderness.  Skin:    General: Skin is warm.  Neurological:     Mental Status: He is alert and oriented to person, place, and time.     Cranial Nerves: No cranial nerve deficit.     Motor: No  abnormal muscle tone.     Coordination: Coordination normal.     Comments: No ataxia on finger to nose bilaterally. No pronator drift. 5/5 strength throughout. CN 2-12 intact.Equal grip strength. Sensation intact.   Psychiatric:        Behavior: Behavior normal.      ED Treatments / Results  Labs (all labs ordered are listed, but only abnormal results are displayed) Labs Reviewed  LIPASE, BLOOD - Abnormal; Notable for the following components:      Result Value   Lipase 56 (*)    All other components within normal limits  COMPREHENSIVE METABOLIC PANEL - Abnormal; Notable for the following components:   Sodium 132 (*)    Chloride 97 (*)    Glucose, Bld 134 (*)    All other components within normal limits  CBC - Abnormal; Notable for the following components:   RBC 4.06 (*)    HCT 38.3 (*)    All other components within normal limits  CBG MONITORING, ED - Abnormal; Notable for the following components:   Glucose-Capillary 140 (*)    All other components within normal limits  CBG MONITORING, ED - Abnormal; Notable for the following components:   Glucose-Capillary 139 (*)    All other components within normal limits  CBG MONITORING, ED - Abnormal; Notable for the following components:   Glucose-Capillary 160 (*)    All other components within normal limits  CBG MONITORING, ED - Abnormal; Notable for the following components:   Glucose-Capillary 170 (*)    All other components within normal limits  URINALYSIS, ROUTINE W REFLEX MICROSCOPIC  TROPONIN I  AMMONIA  CBG MONITORING, ED  I-STAT TROPONIN, ED    EKG EKG Interpretation  Date/Time:  Tuesday March 13 2018 01:37:34 EST Ventricular Rate:  77 PR Interval:    QRS Duration: 92 QT Interval:  378 QTC Calculation: 428 R Axis:   65 Text Interpretation:  Sinus rhythm No  significant change was found Confirmed by Glynn Octave [phone]) on 03/13/2018 1:54:10 AM   Radiology Dg Chest 2 View  Result Date:  03/13/2018 CLINICAL DATA:  Altered mentation with chest pain. EXAM: CHEST - 2 VIEW COMPARISON:  11/01/2015 FINDINGS: Stable overlying cardiomegaly. Nonaneurysmal thoracic aorta. Both lungs are clear. ACDF of the cervical spine is noted without complicating features. The visualized skeletal structures are unremarkable. IMPRESSION: No active cardiopulmonary disease. Electronically Signed   By: Tollie Eth M.D.   On: 03/13/2018 01:36   Ct Head Wo Contrast  Result Date: 03/13/2018 CLINICAL DATA:  Acute loss of consciousness EXAM: CT HEAD WITHOUT CONTRAST TECHNIQUE: Contiguous axial images were obtained from the base of the skull through the vertex without intravenous contrast. COMPARISON:  07/03/2016 FINDINGS: BRAIN: The ventricles and sulci are normal. No intraparenchymal hemorrhage, mass effect nor midline shift. No acute large vascular territory infarcts. Grey-white matter distinction is maintained. The basal ganglia are unremarkable. No abnormal extra-axial fluid collections. Basal cisterns are not effaced and midline. The brainstem and cerebellar hemispheres are without acute abnormalities. VASCULAR: Unremarkable. SKULL/SOFT TISSUES: No skull fracture. No significant soft tissue swelling. ORBITS/SINUSES: The included ocular globes and orbital contents are normal.The mastoid air cells are clear. The included paranasal sinuses are well-aerated. OTHER: None. IMPRESSION: Normal head CT. Electronically Signed   By: Tollie Eth M.D.   On: 03/13/2018 02:40   Ct Abdomen Pelvis W Contrast  Result Date: 03/13/2018 CLINICAL DATA:  Acute abdominal pain. Nausea. EXAM: CT ABDOMEN AND PELVIS WITH CONTRAST TECHNIQUE: Multidetector CT imaging of the abdomen and pelvis was performed using the standard protocol following bolus administration of intravenous contrast. CONTRAST:  OMNIPAQUE IOHEXOL 300 MG/ML  SOLN COMPARISON:  CT 06/19/2016 FINDINGS: Lower chest: The lung bases are clear. There are coronary artery  calcifications. Hepatobiliary: Choose Pancreas: No ductal dilatation or inflammation. Spleen: Normal in size without focal abnormality. Adrenals/Urinary Tract: Normal adrenal glands. No hydronephrosis or perinephric edema. Homogeneous renal enhancement with symmetric excretion on delayed phase imaging. Urinary bladder is physiologically distended without wall thickening. Stomach/Bowel: Fluid in the distal esophagus without wall thickening. Stomach physiologically distended. No bowel wall thickening or inflammatory change. Normal appendix. Mild sigmoid colonic tortuosity. No colonic inflammation. No significant diverticular disease. Vascular/Lymphatic: Aortic and branch atherosclerosis. No aneurysm. No acute vascular findings. Portal vein and mesenteric vessels are patent. No adenopathy. Reproductive: Prostate is unremarkable. Other: No free air, free fluid, or intra-abdominal fluid collection. Previous fat containing umbilical hernia is no longer seen. Soft tissue densities in the subcutaneous tissues of the anterior abdominal wall typically seen with injection site. Musculoskeletal: Degenerative change in the spine and both hips, right greater than left. L5-S1 lumbar fusion. There are no acute or suspicious osseous abnormalities. IMPRESSION: 1. Fluid in the distal esophagus which may be delayed transit or reflux. 2. No other acute abnormality or explanation for abdominal pain. 3. Aortic atherosclerosis, coronary artery calcifications. Aortic Atherosclerosis (ICD10-I70.0). Electronically Signed   By: Narda Rutherford M.D.   On: 03/13/2018 02:34    Procedures Procedures (including critical care time)  Medications Ordered in ED Medications - No data to display   Initial Impression / Assessment and Plan / ED Course  I have reviewed the triage vital signs and the nursing notes.  Pertinent labs & imaging results that were available during my care of the patient were reviewed by me and considered in my  medical decision making (see chart for details).    Diabetic with abdominal pain and nausea for the  past 3 days with low blood sugar intermittently at home.  Blood sugars have been normal throughout his 11-hour wait in the waiting room.  States he did not take his Lantus tonight.  Patient has not had any hypoglycemia throughout his prolonged waiting period.  His work-up was negative for infection with negative urinalysis, chest Xray, and negative head CT.  Abdominal CT does not show any source of his abdominal pain.  Patient given gentle hydration.  Orthostatics are negative.  EKG and serial troponins are negative.  Patient tolerating p.o. and ambulatory.  We will have him decrease his Lantus given his hypoglycemia at home and follow-up with his doctor.  Advised patient to decrease his Lantus dose to 40 units at night and follow-up with his doctor and keep a record of his blood sugar.  Return precautions discussed.  Final Clinical Impressions(s) / ED Diagnoses   Final diagnoses:  Hypoglycemia  Weakness    ED Discharge Orders    None       Cheryal Salas, Jeannett SeniorStephen, MD 03/13/18 [phone]

## 2018-03-13 NOTE — ED Notes (Signed)
PT ambulated mostly steady with moment of unsteadiness

## 2018-04-22 ENCOUNTER — Encounter (HOSPITAL_COMMUNITY): Payer: Self-pay

## 2018-04-22 ENCOUNTER — Ambulatory Visit (HOSPITAL_COMMUNITY)
Admission: EM | Admit: 2018-04-22 | Discharge: 2018-04-22 | Disposition: A | Discharge status: Home / Self Care | Payer: Medicare Other | Attending: Family Medicine | Admitting: Family Medicine

## 2018-04-22 DIAGNOSIS — J0110 Acute frontal sinusitis, unspecified: Secondary | ICD-10-CM | POA: Diagnosis not present

## 2018-04-22 MED ORDER — AMOXICILLIN-POT CLAVULANATE 875-125 MG PO TABS: 1.0000 | ORAL_TABLET | Freq: Two times a day (BID) | ORAL | 0 refills | Status: AC

## 2018-04-22 MED ORDER — SALINE SPRAY 0.65 % NA SOLN: 1.0000 | NASAL | 0 refills | Status: AC | PRN

## 2018-04-22 NOTE — ED Triage Notes (Signed)
Pt presents with upper respiratory cold symptoms; nasal drainage, headaches, nausea, productive cough with brown & green mucus,general body aches, shortness of breath, chest tightness, and fatigue.

## 2018-04-22 NOTE — Discharge Instructions (Signed)
Push fluids to ensure adequate hydration and keep secretions thin.  Tylenol and/or ibuprofen as needed for pain or fevers.  Complete course of antibiotics.  Use of nasal spray to moisturize the nose and to help with congestion.  If symptoms worsen or do not improve in the next week to return to be seen or to follow up with your PCP.

## 2018-04-22 NOTE — ED Notes (Signed)
Pt discharged by provider.

## 2018-04-23 NOTE — ED Provider Notes (Signed)
[facility]    CSN: [id number] Arrival date & time: 04/22/18  1133     History   Chief Complaint Chief Complaint  Patient presents with  . URI    HPI Matthew Montes is a 58 y.o. male.   Matthew Montes presents with complaints of two weeks of cold symptoms with productive cough, nasal congestion, headache, fatigue, chest tightness, shortness of breath , nausea no vomiting, decreased appetite, throat tickle. No ear pain. Has noticed some blood with blowing his nose sometimes. Cough is worse when he first wakes in the morning and improves throughout the day. No fever, tmax oc 99.2. No known ill contacts. Hx of copd, doesn't smoke. bs's have been slightly elevated for him in the 200's. No diarrhea. Uses symbicort and proair with some relief. Has also bee takign cough and cold OTC medication which have minimally helped.  Hx of htn, dm, renal insufficiency.    ROS per HPI, negative if not otherwise mentioned.      Past Medical History:  Diagnosis Date  . ADHD (attention deficit hyperactivity disorder)   . Asthma   . Cardiomegaly   . Chronic back pain   . Chronic neck pain   . COPD (chronic obstructive pulmonary disease) (HCC)   . Diabetes mellitus without complication (HCC)   . Hypertension   . Liver failure (HCC)   . Renal disorder   . Renal insufficiency   . Schizophrenia [facility])     Patient Active Problem List   Diagnosis Date Noted  . Cellulitis of submandibular region 10/10/2015    Past Surgical History:  Procedure Laterality Date  . BACK SURGERY    . NECK SURGERY    . SPINAL FUSION         Home Medications    Prior to Admission medications   Medication Sig Start Date End Date Taking? Authorizing Provider  acetaminophen (TYLENOL) 650 MG CR tablet Take 1,300 mg by mouth every 8 (eight) hours as needed for pain.    [provider]  Acetylcysteine 600 MG CAPS Take 600 mg by mouth 2 (two) times daily.    [provider]    amoxicillin-clavulanate (AUGMENTIN) 875-125 MG tablet Take 1 tablet by mouth every 12 (twelve) hours for 10 days. 04/22/18 05/02/18  Georgetta Haber, NP  aspirin EC 81 MG tablet Take 81 mg by mouth daily.     [provider]  atorvastatin (LIPITOR) 40 MG tablet Take 40 mg by mouth daily.    [provider]  baclofen (LIORESAL) 10 MG tablet Take 10 mg by mouth 2 (two) times daily as needed for muscle spasms.    [provider]  diclofenac sodium (VOLTAREN) 1 % GEL Apply 2 g topically 4 (four) times daily.    [provider]  esomeprazole (NEXIUM) 40 MG capsule Take 40 mg by mouth daily before breakfast. 10/07/15   [provider]  ferrous gluconate (FERGON) 324 MG tablet Take 324 mg by mouth daily with breakfast.    [provider]  fluocinonide cream (LIDEX) 0.05 % Apply 1 application topically 2 (two) times daily as needed (for itching).    [provider]  FLUoxetine (PROZAC) 20 MG capsule Take 60 mg by mouth daily.    [provider]  HYDROcodone-acetaminophen (NORCO) 5-325 MG tablet Take 1 tablet by mouth every 6 (six) hours as needed for moderate pain. Patient not taking: Reported on 03/13/2018 07/03/16   Irean Hong, MD  LANTUS SOLOSTAR 100 UNIT/ML  Solostar Pen Inject 10-60 Units into the skin See admin instructions. Use 10 units every morning and use 60 units every evening 10/07/15   [provider]  liraglutide (VICTOZA) 18 MG/3ML SOPN Inject 1.8 mg into the skin daily.    [provider]  lisinopril (PRINIVIL,ZESTRIL) 10 MG tablet Take 10 mg by mouth daily.    [provider]  loperamide (IMODIUM A-D) 2 MG tablet Take 1 tablet (2 mg total) by mouth 4 (four) times daily as needed for diarrhea or loose stools. Patient not taking: Reported on 03/13/2018 06/19/16   Rockne Menghini, MD  loperamide (IMODIUM) 2 MG capsule Take 2 mg by mouth 4 (four) times daily as needed for diarrhea or loose stools.     [provider]  Magnesium 250 MG TABS Take 750 mg by mouth daily.    [provider]  meclizine (ANTIVERT) 25 MG tablet Take 1 tablet (25 mg total) by mouth 3 (three) times daily as needed for dizziness or nausea. 07/03/16   Irean Hong, MD  Melatonin 3 MG TABS Take 9 mg by mouth at bedtime.    [provider]  metFORMIN (GLUCOPHAGE-XR) 500 MG 24 hr tablet Take 1,000 mg by mouth 2 (two) times daily.    [provider]  metoprolol succinate (TOPROL-XL) 50 MG 24 hr tablet Take 50 mg by mouth daily.  10/07/15   [provider]  Multiple Vitamin (MULTIVITAMIN WITH MINERALS) TABS tablet Take 1 tablet by mouth daily.    [provider]  mupirocin ointment (BACTROBAN) 2 % Place 1 application into the nose 2 (two) times daily.    [provider]  ondansetron (ZOFRAN ODT) 4 MG disintegrating tablet Take 1 tablet (4 mg total) by mouth every 8 (eight) hours as needed for nausea or vomiting. Patient not taking: Reported on 03/13/2018 07/03/16   Irean Hong, MD  ondansetron (ZOFRAN) 4 MG tablet Take 4 mg by mouth every 8 (eight) hours as needed for nausea or vomiting.    [provider]  predniSONE (DELTASONE) 10 MG tablet Label  & dispense according to the schedule below. 5 Pills PO for 1 day then, 4 Pills PO for 1 day, 3 Pills PO for 1 day, 2 Pills PO for 1 day, 1 Pill PO for 1 days then STOP. Patient not taking: Reported on 11/01/2015 10/11/15   Houston Siren, MD  pregabalin (LYRICA) 150 MG capsule Take 150 mg by mouth 3 (three) times daily.    [provider]  QUEtiapine (SEROQUEL) 200 MG tablet Take 400 mg by mouth at bedtime.    [provider]  sodium chloride (OCEAN) 0.65 % SOLN nasal spray Place 1 spray into both nostrils as needed for congestion. 04/22/18   Georgetta Haber, NP  tamsulosin (FLOMAX) 0.4 MG CAPS capsule Take 0.8 mg by mouth daily.  10/07/15   [provider]  traMADol (ULTRAM) 50 MG tablet  Take 50 mg by mouth every 4 (four) hours as needed for moderate pain.  09/10/15   [provider]  vitamin B-12 (CYANOCOBALAMIN) 1000 MCG tablet Take 1,000 mcg by mouth daily.    [provider]    Family History Family History  Problem Relation Age of Onset  . Heart attack Mother   . Heart disease Father   . Diabetes Father     Social History Social History   Tobacco Use  . Smoking status: Former Games developer  . Smokeless tobacco: Current User  Types: Chew  Substance Use Topics  . Alcohol use: Yes    Comment: 3 drinks/month.   . Drug use: Not on file     Allergies   Patient has no known allergies.   Review of Systems Review of Systems   Physical Exam Triage Vital Signs ED Triage Vitals  Enc Vitals Group     BP 04/22/18 1248 (!) 169/82     Pulse Rate 04/22/18 1248 96     Resp 04/22/18 1248 17     Temp 04/22/18 1248 98.4 F (36.9 C)     Temp Source 04/22/18 1248 Oral     SpO2 04/22/18 1248 99 %     Weight --      Height --      Head Circumference --      Peak Flow --      Pain Score 04/22/18 1251 6     Pain Loc --      Pain Edu? --      Excl. in GC? --    No data found.  Updated Vital Signs BP (!) 169/82 (BP Location: Right Arm)   Pulse 96   Temp 98.4 F (36.9 C) (Oral)   Resp 17   SpO2 99%   Physical Exam Vitals signs reviewed.  Constitutional:      Appearance: He is well-developed.  HENT:     Head: Normocephalic and atraumatic.     Right Ear: Tympanic membrane, ear canal and external ear normal.     Left Ear: Tympanic membrane, ear canal and external ear normal.     Nose: Mucosal edema and rhinorrhea present.     Right Sinus: Frontal sinus tenderness present. No maxillary sinus tenderness.     Left Sinus: Frontal sinus tenderness present. No maxillary sinus tenderness.     Mouth/Throat:     Pharynx: Uvula midline.  Eyes:     Conjunctiva/sclera: Conjunctivae normal.     Pupils: Pupils are equal, round, and reactive to light.    Neck:     Musculoskeletal: Normal range of motion.  Cardiovascular:     Rate and Rhythm: Normal rate and regular rhythm.  Pulmonary:     Effort: Pulmonary effort is normal.     Breath sounds: Normal breath sounds.  Lymphadenopathy:     Cervical: No cervical adenopathy.  Skin:    General: Skin is warm and dry.  Neurological:     Mental Status: He is alert and oriented to person, place, and time.      UC Treatments / Results  Labs (all labs ordered are listed, but only abnormal results are displayed) Labs Reviewed - No data to display  EKG None  Radiology No results found.  Procedures Procedures (including critical care time)  Medications Ordered in UC Medications - No data to display  Initial Impression / Assessment and Plan / UC Course  I have reviewed the triage vital signs and the nursing notes.  Pertinent labs & imaging results that were available during my care of the patient were reviewed by me and considered in my medical decision making (see chart for details).     Two weeks of sinus symptoms which are not improving. Will cover with augmentin. Continue with supportive cares. If symptoms worsen or do not improve in the next week to return to be seen or to follow up with PCP.  Patient verbalized understanding and agreeable to plan.   Final Clinical Impressions(s) / UC Diagnoses   Final diagnoses:  Acute  frontal sinusitis, recurrence not specified     Discharge Instructions     Push fluids to ensure adequate hydration and keep secretions thin.  Tylenol and/or ibuprofen as needed for pain or fevers.  Complete course of antibiotics.  Use of nasal spray to moisturize the nose and to help with congestion.  If symptoms worsen or do not improve in the next week to return to be seen or to follow up with your PCP.     ED Prescriptions    Medication Sig Dispense Auth. Provider   amoxicillin-clavulanate (AUGMENTIN) 875-125 MG tablet Take 1 tablet by mouth  every 12 (twelve) hours for 10 days. 20 tablet Linus Mako B, NP   sodium chloride (OCEAN) 0.65 % SOLN nasal spray Place 1 spray into both nostrils as needed for congestion. 60 mL Linus Mako B, NP     Controlled Substance Prescriptions [place] Controlled Substance Registry consulted? Not Applicable   Georgetta Haber, NP 04/23/18 1306

## 2018-04-26 IMAGING — DX DG CHEST 1V
1 series · 1 of 1 positions shown · non-contrast
Comparison: 08/27/2011

CLINICAL DATA: Acute shortness of breath and tachycardia.

EXAM:
CHEST 1 VIEW

[chest ap]
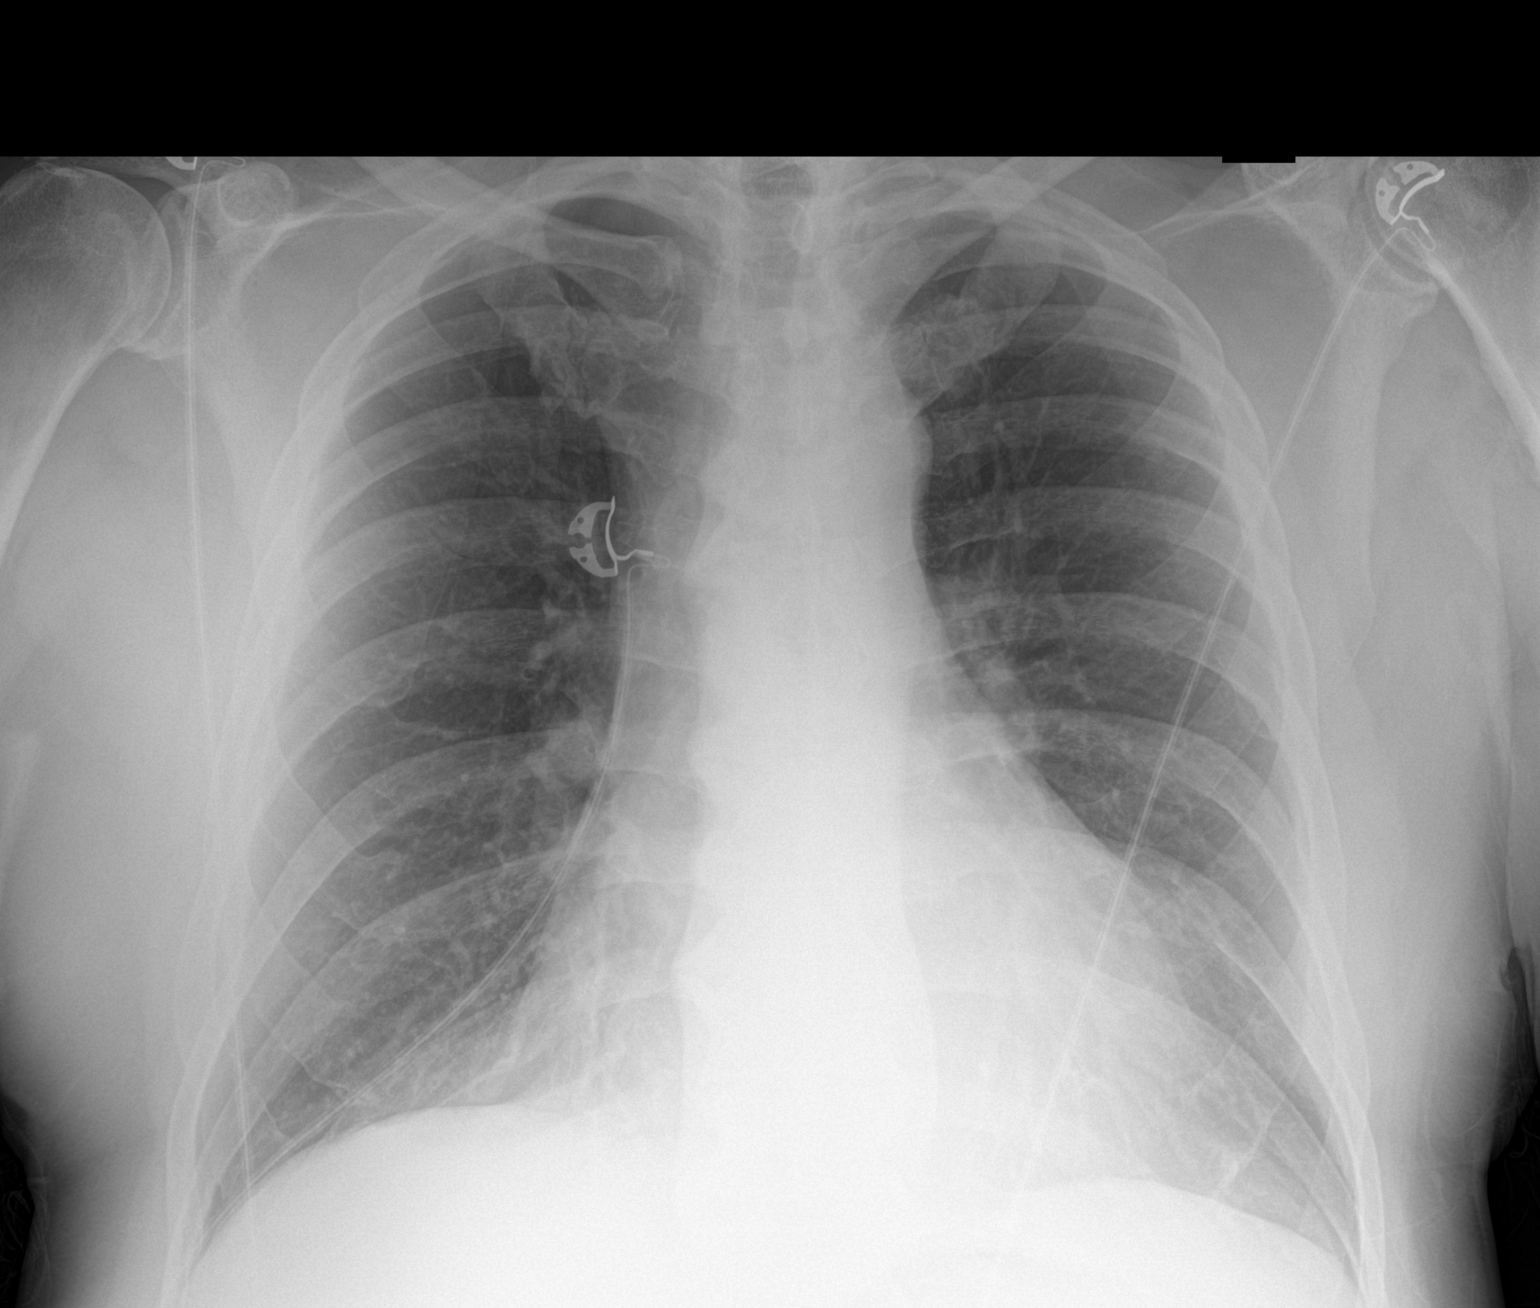

[1 of 1 positions shown; findings below may reference images not displayed]

FINDINGS: Mild hyperinflation of the lungs. Heart is borderline in size. No
confluent airspace opacity or effusion. No acute bony abnormality.
IMPRESSION: Mild hyperinflation.  No active disease.

## 2018-05-12 ENCOUNTER — Other Ambulatory Visit: Payer: Self-pay

## 2018-05-12 ENCOUNTER — Emergency Department (HOSPITAL_COMMUNITY): Payer: Medicare Other

## 2018-05-12 ENCOUNTER — Emergency Department (HOSPITAL_COMMUNITY)
Admission: EM | Admit: 2018-05-12 | Discharge: 2018-05-13 | Disposition: A | Discharge status: Home / Self Care | Payer: Medicare Other | Attending: Emergency Medicine | Admitting: Emergency Medicine

## 2018-05-12 ENCOUNTER — Encounter (HOSPITAL_COMMUNITY): Payer: Self-pay | Admitting: Emergency Medicine

## 2018-05-12 DIAGNOSIS — Z794 Long term (current) use of insulin: Secondary | ICD-10-CM | POA: Insufficient documentation

## 2018-05-12 DIAGNOSIS — Z79899 Other long term (current) drug therapy: Secondary | ICD-10-CM | POA: Diagnosis not present

## 2018-05-12 DIAGNOSIS — R1010 Upper abdominal pain, unspecified: Secondary | ICD-10-CM | POA: Diagnosis present

## 2018-05-12 DIAGNOSIS — J449 Chronic obstructive pulmonary disease, unspecified: Secondary | ICD-10-CM | POA: Diagnosis not present

## 2018-05-12 DIAGNOSIS — R0789 Other chest pain: Secondary | ICD-10-CM | POA: Insufficient documentation

## 2018-05-12 DIAGNOSIS — Z7982 Long term (current) use of aspirin: Secondary | ICD-10-CM | POA: Insufficient documentation

## 2018-05-12 DIAGNOSIS — F17220 Nicotine dependence, chewing tobacco, uncomplicated: Secondary | ICD-10-CM | POA: Insufficient documentation

## 2018-05-12 DIAGNOSIS — R1032 Left lower quadrant pain: Secondary | ICD-10-CM | POA: Diagnosis not present

## 2018-05-12 DIAGNOSIS — R072 Precordial pain: Secondary | ICD-10-CM | POA: Diagnosis not present

## 2018-05-12 DIAGNOSIS — R112 Nausea with vomiting, unspecified: Secondary | ICD-10-CM | POA: Insufficient documentation

## 2018-05-12 DIAGNOSIS — R109 Unspecified abdominal pain: Secondary | ICD-10-CM

## 2018-05-12 DIAGNOSIS — I10 Essential (primary) hypertension: Secondary | ICD-10-CM | POA: Insufficient documentation

## 2018-05-12 DIAGNOSIS — E119 Type 2 diabetes mellitus without complications: Secondary | ICD-10-CM | POA: Diagnosis not present

## 2018-05-12 LAB — CBC WITH DIFFERENTIAL/PLATELET
Abs Immature Granulocytes: 0.03 10*3/uL (ref 0.00–0.07)
Basophils Absolute: 0 10*3/uL (ref 0.0–0.1)
Basophils Relative: 0 %
EOS ABS: 0.1 10*3/uL (ref 0.0–0.5)
Eosinophils Relative: 2 %
HCT: 36.2 % — ABNORMAL LOW (ref 39.0–52.0)
Hemoglobin: 12.4 g/dL — ABNORMAL LOW (ref 13.0–17.0)
Immature Granulocytes: 0 %
Lymphocytes Relative: 17 %
Lymphs Abs: 1.4 10*3/uL (ref 0.7–4.0)
MCH: 32.4 pg (ref 26.0–34.0)
MCHC: 34.3 g/dL (ref 30.0–36.0)
MCV: 94.5 fL (ref 80.0–100.0)
Monocytes Absolute: 0.6 10*3/uL (ref 0.1–1.0)
Monocytes Relative: 7 %
Neutro Abs: 5.9 10*3/uL (ref 1.7–7.7)
Neutrophils Relative %: 74 %
Platelets: 246 10*3/uL (ref 150–400)
RBC: 3.83 MIL/uL — ABNORMAL LOW (ref 4.22–5.81)
RDW: 12.6 % (ref 11.5–15.5)
WBC: 8 10*3/uL (ref 4.0–10.5)
nRBC: 0 % (ref 0.0–0.2)

## 2018-05-12 LAB — COMPREHENSIVE METABOLIC PANEL
ALT: 18 U/L (ref 0–44)
AST: 20 U/L (ref 15–41)
Albumin: 3.8 g/dL (ref 3.5–5.0)
Alkaline Phosphatase: 53 U/L (ref 38–126)
Anion gap: 13 (ref 5–15)
BILIRUBIN TOTAL: 0.7 mg/dL (ref 0.3–1.2)
BUN: 10 mg/dL (ref 6–20)
CO2: 24 mmol/L (ref 22–32)
Calcium: 9.4 mg/dL (ref 8.9–10.3)
Chloride: 93 mmol/L — ABNORMAL LOW (ref 98–111)
Creatinine, Ser: 0.95 mg/dL (ref 0.61–1.24)
GFR calc Af Amer: >60 mL/min (ref >60)
GFR calc non Af Amer: >60 mL/min (ref >60)
Glucose, Bld: 159 mg/dL — ABNORMAL HIGH (ref 70–99)
Potassium: 3.8 mmol/L (ref 3.5–5.1)
Sodium: 130 mmol/L — ABNORMAL LOW (ref 135–145)
TOTAL PROTEIN: 6.2 g/dL — AB (ref 6.5–8.1)

## 2018-05-12 LAB — CBG MONITORING, ED: Glucose-Capillary: 152 mg/dL — ABNORMAL HIGH (ref 70–99)

## 2018-05-12 LAB — TROPONIN I

## 2018-05-12 MED ORDER — ONDANSETRON HCL 4 MG/2ML IJ SOLN
4.0000 mg | Freq: Once | INTRAMUSCULAR | Status: AC
  Administered 2018-05-12: 4 mg via INTRAVENOUS
  Filled 2018-05-12: qty 2

## 2018-05-12 MED ORDER — IOHEXOL 300 MG/ML  SOLN
100.0000 mL | Freq: Once | INTRAMUSCULAR | Status: AC | PRN
  Administered 2018-05-12: 100 mL via INTRAVENOUS

## 2018-05-12 MED ORDER — SODIUM CHLORIDE 0.9 % IV BOLUS
1000.0000 mL | Freq: Once | INTRAVENOUS | Status: AC
  Administered 2018-05-12: 1000 mL via INTRAVENOUS

## 2018-05-12 MED ORDER — ONDANSETRON 8 MG PO TBDP: 8.0000 mg | ORAL_TABLET | Freq: Three times a day (TID) | ORAL | 0 refills | Status: DC | PRN

## 2018-05-12 MED ORDER — MORPHINE SULFATE (PF) 4 MG/ML IV SOLN
4.0000 mg | Freq: Once | INTRAVENOUS | Status: AC
  Administered 2018-05-12: 4 mg via INTRAVENOUS
  Filled 2018-05-12: qty 1

## 2018-05-12 NOTE — ED Triage Notes (Signed)
Per [facility], Pt reports sharp central CP radiating L side and R side x 2 days. Pt reports diarrhea x 3 days. Pt reports feeling nauseous and 1 episode of emesis today. Pt reports some SHOB, pt reports typically SHOB on exertion. Pt denies fever, cough, travel outside of state, or sick contact. Pt also reports generalized abdominal pain. EMS placed pt on 3L [place] for comfort, O2 sats on room air 98%

## 2018-05-12 NOTE — ED Notes (Signed)
Patient transported to X-ray 

## 2018-05-12 NOTE — ED Provider Notes (Addendum)
[facility] EMERGENCY DEPARTMENT Provider Note   CSN: [id number] Arrival date & time: 05/12/18  1912    History   Chief Complaint Chief Complaint  Patient presents with  . Chest Pain    HPI Matthew Montes is a 58 y.o. male.     HPI Patient is a 58 year old male who reports upper abdominal pain and lower central chest pain with some radiation to both sides over the past 2 days.  He also reports diarrhea and worsening abdominal pain over the past 3 days.  He states the majority of his abdominal pain is in his left lower quadrant.  He reports nausea and one episode of vomiting today.  No history of fever cough or recent sick contacts.  He has had no exposure to COVID-19 patients or patients under investigation.  No travel outside this country or outside the state.  No history of DVT or pulmonary embolism.  He has a history of COPD and asthma.  He states his been using his inhaler with some improvement this week.  Denies a history of known coronary artery disease.  He believes he may have had a heart catheterization performed at [facility] at some time several years ago at which point he was told he did not have coronary artery disease and he reports no stents were placed at that time     Past Medical History:  Diagnosis Date  . ADHD (attention deficit hyperactivity disorder)   . Asthma   . Cardiomegaly   . Chronic back pain   . Chronic neck pain   . COPD (chronic obstructive pulmonary disease) (HCC)   . Diabetes mellitus without complication (HCC)   . Hypertension   . Liver failure (HCC)   . Renal disorder   . Renal insufficiency   . [facility])     Patient Active Problem List   Diagnosis Date Noted  . Cellulitis of submandibular region 10/10/2015    Past Surgical History:  Procedure Laterality Date  . BACK SURGERY    . NECK SURGERY    . SPINAL FUSION          Home Medications    Prior to Admission medications   Medication Sig  Start Date End Date Taking? Authorizing Provider  acetaminophen (TYLENOL) 650 MG CR tablet Take 1,300 mg by mouth every 8 (eight) hours as needed for pain.   Yes [provider]  Acetylcysteine 600 MG CAPS Take 600 mg by mouth 2 (two) times daily.   Yes [provider]  aspirin EC 81 MG tablet Take 81 mg by mouth daily.    Yes [provider]  atorvastatin (LIPITOR) 40 MG tablet Take 40 mg by mouth daily.   Yes [provider]  baclofen (LIORESAL) 10 MG tablet Take 10 mg by mouth 2 (two) times daily as needed for muscle spasms.   Yes [provider]  diclofenac sodium (VOLTAREN) 1 % GEL Apply 2 g topically 4 (four) times daily.   Yes [provider]  esomeprazole (NEXIUM) 40 MG capsule Take 40 mg by mouth daily before breakfast. 10/07/15  Yes [provider]  ferrous gluconate (FERGON) 324 MG tablet Take 324 mg by mouth daily with breakfast.   Yes [provider]  fluocinonide cream (LIDEX) 0.05 % Apply 1 application topically 2 (two) times daily as needed (for itching).   Yes [provider]  FLUoxetine (PROZAC) 20 MG capsule Take 60 mg by mouth daily.   Yes  [provider]  gabapentin (NEURONTIN) 300 MG capsule Take 900 mg by mouth 3 (three) times daily. 05/07/18  Yes [provider]  LANTUS SOLOSTAR 100 UNIT/ML Solostar Pen Inject 10-40 Units into the skin See admin instructions. Use 10 units every morning and use 40 units every evening 10/07/15  Yes [provider]  liraglutide (VICTOZA) 18 MG/3ML SOPN Inject 1.8 mg into the skin daily.   Yes [provider]  lisinopril (PRINIVIL,ZESTRIL) 10 MG tablet Take 10 mg by mouth daily.   Yes [provider]  loperamide (IMODIUM) 2 MG capsule Take 2 mg by mouth 4 (four) times daily as needed for diarrhea or loose stools.   Yes [provider]  Magnesium 250 MG TABS Take 750 mg by mouth daily.   Yes [provider]   meclizine (ANTIVERT) 25 MG tablet Take 1 tablet (25 mg total) by mouth 3 (three) times daily as needed for dizziness or nausea. 07/03/16  Yes Irean Hong, MD  Melatonin 3 MG TABS Take 9 mg by mouth at bedtime.   Yes [provider]  metFORMIN (GLUCOPHAGE-XR) 500 MG 24 hr tablet Take 1,000 mg by mouth 2 (two) times daily.   Yes [provider]  metoprolol succinate (TOPROL-XL) 50 MG 24 hr tablet Take 50 mg by mouth daily.  10/07/15  Yes [provider]  Multiple Vitamin (MULTIVITAMIN WITH MINERALS) TABS tablet Take 1 tablet by mouth daily.   Yes [provider]  mupirocin ointment (BACTROBAN) 2 % Apply 1 application topically 2 (two) times daily as needed (skin care).    Yes [provider]  ondansetron (ZOFRAN) 4 MG tablet Take 4 mg by mouth every 8 (eight) hours as needed for nausea or vomiting.   Yes [provider]  QUEtiapine (SEROQUEL) 200 MG tablet Take 400 mg by mouth at bedtime.   Yes [provider]  sodium chloride (OCEAN) 0.65 % SOLN nasal spray Place 1 spray into both nostrils as needed for congestion. 04/22/18  Yes Burky, Dorene Grebe B, NP  tamsulosin (FLOMAX) 0.4 MG CAPS capsule Take 0.8 mg by mouth daily.  10/07/15  Yes [provider]  traMADol (ULTRAM) 50 MG tablet Take 50 mg by mouth every 4 (four) hours as needed for moderate pain.  09/10/15  Yes [provider]  vitamin B-12 (CYANOCOBALAMIN) 1000 MCG tablet Take 1,000 mcg by mouth daily.   Yes [provider]  loperamide (IMODIUM A-D) 2 MG tablet Take 1 tablet (2 mg total) by mouth 4 (four) times daily as needed for diarrhea or loose stools. Patient not taking: Reported on 03/13/2018 06/19/16   Rockne Menghini, MD  ondansetron (ZOFRAN ODT) 4 MG disintegrating tablet Take 1 tablet (4 mg total) by mouth every 8 (eight) hours as needed for nausea or vomiting. Patient not taking: Reported on 03/13/2018 07/03/16   Irean Hong, MD  predniSONE (DELTASONE)  10 MG tablet Label  & dispense according to the schedule below. 5 Pills PO for 1 day then, 4 Pills PO for 1 day, 3 Pills PO for 1 day, 2 Pills PO for 1 day, 1 Pill PO for 1 days then STOP. Patient not taking: Reported on 11/01/2015 10/11/15   Houston Siren, MD    Family History Family History  Problem Relation Age of Onset  . Heart attack Mother   . Heart disease Father   . Diabetes Father     Social History Social History   Tobacco Use  . Smoking  status: Former Games developer  . Smokeless tobacco: Current User    Types: Chew  Substance Use Topics  . Alcohol use: Yes    Comment: 3 drinks/month.   . Drug use: Not on file     Allergies   Patient has no known allergies.   Review of Systems Review of Systems  All other systems reviewed and are negative.    Physical Exam Updated Vital Signs BP 132/74   Pulse 88   Temp 98 F (36.7 C) (Oral)   Resp 13   Ht 5\' 11"  (1.803 m)   Wt 105.2 kg   SpO2 100%   BMI 32.36 kg/m   Physical Exam Vitals signs and nursing note reviewed.  Constitutional:      Appearance: He is well-developed.  HENT:     Head: Normocephalic and atraumatic.  Neck:     Musculoskeletal: Normal range of motion.  Cardiovascular:     Rate and Rhythm: Normal rate and regular rhythm.     Heart sounds: Normal heart sounds.  Pulmonary:     Effort: Pulmonary effort is normal. No respiratory distress.     Breath sounds: Normal breath sounds.  Abdominal:     General: There is no distension.     Palpations: Abdomen is soft.     Tenderness: There is no abdominal tenderness.  Musculoskeletal: Normal range of motion.  Skin:    General: Skin is warm and dry.  Neurological:     Mental Status: He is alert and oriented to person, place, and time.  Psychiatric:        Judgment: Judgment normal.      ED Treatments / Results  Labs (all labs ordered are listed, but only abnormal results are displayed) Labs Reviewed  CBC WITH DIFFERENTIAL/PLATELET - Abnormal;  Notable for the following components:      Result Value   RBC 3.83 (*)    Hemoglobin 12.4 (*)    HCT 36.2 (*)    All other components within normal limits  COMPREHENSIVE METABOLIC PANEL - Abnormal; Notable for the following components:   Sodium 130 (*)    Chloride 93 (*)    Glucose, Bld 159 (*)    Total Protein 6.2 (*)    All other components within normal limits  CBG MONITORING, ED - Abnormal; Notable for the following components:   Glucose-Capillary 152 (*)    All other components within normal limits  TROPONIN I  TROPONIN I    EKG EKG Interpretation  Date/Time:  Saturday May 12 2018 19:18:56 EDT Ventricular Rate:  89 PR Interval:    QRS Duration: 94 QT Interval:  360 QTC Calculation: 438 R Axis:   67 Text Interpretation:  Sinus rhythm No significant change was found Confirmed by Azalia Bilis ([id number]) on 05/12/2018 8:34:35 PM   Radiology Dg Chest 2 View  Result Date: 05/12/2018 CLINICAL DATA:  Chest pain and shortness of breath. EXAM: CHEST - 2 VIEW COMPARISON:  03/13/2018 FINDINGS: The cardiomediastinal contours are normal. The lungs are clear. Pulmonary vasculature is normal. No consolidation, pleural effusion, or pneumothorax. No acute osseous abnormalities are seen. Surgical hardware in the lower cervical spine is partially included. IMPRESSION: No acute chest findings. Electronically Signed   By: Narda Rutherford M.D.   On: 05/12/2018 19:59   Ct Abdomen Pelvis W Contrast  Result Date: 05/12/2018 CLINICAL DATA:  Left lower quadrant pain, diarrhea EXAM: CT ABDOMEN AND PELVIS WITH CONTRAST TECHNIQUE: Multidetector CT imaging of the abdomen and pelvis was performed using  the standard protocol following bolus administration of intravenous contrast. CONTRAST:  OMNIPAQUE IOHEXOL 300 MG/ML  SOLN COMPARISON:  03/13/2018 FINDINGS: Lower chest: No acute abnormality. Coronary artery calcifications. Hepatobiliary: No focal liver abnormality is seen. No gallstones, gallbladder  wall thickening, or biliary dilatation. Pancreas: Unremarkable. No pancreatic ductal dilatation or surrounding inflammatory changes. Spleen: Normal in size without focal abnormality. Adrenals/Urinary Tract: Adrenal glands are unremarkable. Kidneys are normal, without renal calculi, focal lesion, or hydronephrosis. Bladder is unremarkable. Stomach/Bowel: Stomach is within normal limits. Appendix appears normal. No evidence of bowel wall thickening, distention, or inflammatory changes. Vascular/Lymphatic: Scattered calcific atherosclerosis. No enlarged abdominal or pelvic lymph nodes. Reproductive: No mass or other abnormality. Other: No abdominal wall hernia or abnormality. Evidence of prior umbilical or urinary pair without evidence of recurrent hernia. No abdominopelvic ascites. Musculoskeletal: No acute or significant osseous findings. IMPRESSION: 1.  No acute CT findings of the abdomen or pelvis to explain pain. 2.  Chronic and incidental findings as detailed above. Electronically Signed   By: Lauralyn Primes M.D.   On: 05/12/2018 22:00    Heart catheterization February 25, 2015 [facility] [place]) FINDINGS  Coronary Angiography  Dominance: Right  Left main (LMCA): LMCA is a large-caliber, very short vessel that  originates from the left coronary sinus. It bifurcates into the left  anterior descending (LAD) and left circumflex (LCx) arteries. There is no  angiographic evidence of significant disease in the LMCA.   Left anterior descending (LAD): The LAD is a medium-caliber vessel that  gives off four diagonal (D) branches before it wraps around the apex. D1  and D2 are medium to smaller caliber vessels. D3 and D4 are very small  caliber vessels.There is no angiographic evidence of significant disease  in the LAD.   Circumflex (LCx): The LCx is a medium-caliber vessel that gives off two  obtuse marginal (OM) branches and then continues as a small vessel in the  AV groove. OM1 is a medium to small  caliber vessel. OM2 is a medium to  small caliber vessel. There is no angiographic evidence of significant  disease in the LCx.   Right Coronary (RCA): The RCA is a large-caliber vessel originating from  the right coronary sinus. It bifurcates distally into the posterior  descending artery (PDA) and 4 posterolateral branches (PL) consistent with  a right dominant system. There is no angiographic evidence of significant  disease in the RCA.  Procedures Procedures (including critical care time)  Medications Ordered in ED Medications  sodium chloride 0.9 % bolus 1,000 mL (1,000 mLs Intravenous New Bag/Given 05/12/18 2118)  morphine 4 MG/ML injection 4 mg (4 mg Intravenous Given 05/12/18 2120)  ondansetron (ZOFRAN) injection 4 mg (4 mg Intravenous Given 05/12/18 2119)  iohexol (OMNIPAQUE) 300 MG/ML solution 100 mL (100 mLs Intravenous Contrast Given 05/12/18 2137)     Initial Impression / Assessment and Plan / ED Course  I have reviewed the triage vital signs and the nursing notes.  Pertinent labs & imaging results that were available during my care of the patient were reviewed by me and considered in my medical decision making (see chart for details).       Reassuring heart catheterization in January 2017.  Patient will obtain a second troponin here in the emergency department.  Initial troponin negative.  EKG without ischemic changes.  In regards to his abdominal pain that is nonspecific and located in the left side of the abdomen and left lower quadrant.  His CT scan  is reassuring without intra-abdominal pathology.  I personally reviewed the patient's CT imaging with the findings above.  Patient is stable for discharge from the emergency department once his second troponin is negative.  He understands that close follow-up with his primary care physician is important and he understands return to the emergency department for new or worsening symptoms.  Home with a prescription for Zofran.    Matthew Montes was evaluated in Emergency Department on 05/12/2018 for the symptoms described in the history of present illness. He was evaluated in the context of the global COVID-19 pandemic, which necessitated consideration that the patient might be at risk for infection with the SARS-CoV-2 virus that causes COVID-19. Institutional protocols and algorithms that pertain to the evaluation of patients at risk for COVID-19 are in a state of rapid change based on information released by regulatory bodies including the CDC and federal and state organizations. These policies and algorithms were followed during the patient's care in the ED.   Final Clinical Impressions(s) / ED Diagnoses   Final diagnoses:  None    ED Discharge Orders    None       Azalia Bilis, MD 05/12/18 Arletha Grippe    Azalia Bilis, MD 05/29/18 [phone]

## 2018-05-13 LAB — TROPONIN I: Troponin I: <0.03 ng/mL (ref <0.03)

## 2018-05-13 NOTE — ED Notes (Signed)
Discussed with the patient and all questions fully answered. Prescriptions discussed as well as follow up care with PCP. Pt unable to provide esignature. Pt verbally consents to discharge.

## 2018-12-08 ENCOUNTER — Encounter (HOSPITAL_COMMUNITY): Payer: Self-pay | Admitting: Emergency Medicine

## 2018-12-08 ENCOUNTER — Emergency Department (HOSPITAL_COMMUNITY)
Admission: EM | Admit: 2018-12-08 | Discharge: 2018-12-08 | Disposition: A | Discharge status: Home / Self Care | Payer: Medicare Other | Attending: Emergency Medicine | Admitting: Emergency Medicine

## 2018-12-08 ENCOUNTER — Emergency Department (HOSPITAL_COMMUNITY): Payer: Medicare Other

## 2018-12-08 ENCOUNTER — Other Ambulatory Visit: Payer: Self-pay

## 2018-12-08 DIAGNOSIS — R42 Dizziness and giddiness: Secondary | ICD-10-CM

## 2018-12-08 DIAGNOSIS — E119 Type 2 diabetes mellitus without complications: Secondary | ICD-10-CM | POA: Insufficient documentation

## 2018-12-08 DIAGNOSIS — Z7982 Long term (current) use of aspirin: Secondary | ICD-10-CM | POA: Diagnosis not present

## 2018-12-08 DIAGNOSIS — J449 Chronic obstructive pulmonary disease, unspecified: Secondary | ICD-10-CM | POA: Diagnosis not present

## 2018-12-08 DIAGNOSIS — Z20828 Contact with and (suspected) exposure to other viral communicable diseases: Secondary | ICD-10-CM | POA: Insufficient documentation

## 2018-12-08 DIAGNOSIS — Z87891 Personal history of nicotine dependence: Secondary | ICD-10-CM | POA: Insufficient documentation

## 2018-12-08 DIAGNOSIS — R197 Diarrhea, unspecified: Secondary | ICD-10-CM | POA: Insufficient documentation

## 2018-12-08 DIAGNOSIS — Z79899 Other long term (current) drug therapy: Secondary | ICD-10-CM | POA: Insufficient documentation

## 2018-12-08 DIAGNOSIS — R05 Cough: Secondary | ICD-10-CM | POA: Diagnosis not present

## 2018-12-08 DIAGNOSIS — R059 Cough, unspecified: Secondary | ICD-10-CM

## 2018-12-08 DIAGNOSIS — Z794 Long term (current) use of insulin: Secondary | ICD-10-CM | POA: Diagnosis not present

## 2018-12-08 DIAGNOSIS — I10 Essential (primary) hypertension: Secondary | ICD-10-CM | POA: Diagnosis not present

## 2018-12-08 LAB — CBC
HCT: 36.9 % — ABNORMAL LOW (ref 39.0–52.0)
Hemoglobin: 12.5 g/dL — ABNORMAL LOW (ref 13.0–17.0)
MCH: 33.2 pg (ref 26.0–34.0)
MCHC: 33.9 g/dL (ref 30.0–36.0)
MCV: 98.1 fL (ref 80.0–100.0)
Platelets: 288 10*3/uL (ref 150–400)
RBC: 3.76 MIL/uL — ABNORMAL LOW (ref 4.22–5.81)
RDW: 12.7 % (ref 11.5–15.5)
WBC: 6.7 10*3/uL (ref 4.0–10.5)
nRBC: 0 % (ref 0.0–0.2)

## 2018-12-08 LAB — COMPREHENSIVE METABOLIC PANEL
ALT: 28 U/L (ref 0–44)
AST: 22 U/L (ref 15–41)
Albumin: 3.9 g/dL (ref 3.5–5.0)
Alkaline Phosphatase: 46 U/L (ref 38–126)
Anion gap: 11 (ref 5–15)
BUN: 11 mg/dL (ref 6–20)
CO2: 23 mmol/L (ref 22–32)
Calcium: 9.4 mg/dL (ref 8.9–10.3)
Chloride: 101 mmol/L (ref 98–111)
Creatinine, Ser: 0.81 mg/dL (ref 0.61–1.24)
GFR calc Af Amer: >60 mL/min (ref >60)
GFR calc non Af Amer: >60 mL/min (ref >60)
Glucose, Bld: 225 mg/dL — ABNORMAL HIGH (ref 70–99)
Potassium: 4.3 mmol/L (ref 3.5–5.1)
Sodium: 135 mmol/L (ref 135–145)
Total Bilirubin: 0.6 mg/dL (ref 0.3–1.2)
Total Protein: 5.9 g/dL — ABNORMAL LOW (ref 6.5–8.1)

## 2018-12-08 LAB — TYPE AND SCREEN
ABO/RH(D): A POS
Antibody Screen: NEGATIVE

## 2018-12-08 LAB — URINALYSIS, ROUTINE W REFLEX MICROSCOPIC
Bilirubin Urine: NEGATIVE
Glucose, UA: 150 mg/dL — AB
Hgb urine dipstick: NEGATIVE
Ketones, ur: NEGATIVE mg/dL
Leukocytes,Ua: NEGATIVE
Nitrite: NEGATIVE
Protein, ur: NEGATIVE mg/dL
Specific Gravity, Urine: 1.016 (ref 1.005–1.030)
pH: 5 (ref 5.0–8.0)

## 2018-12-08 LAB — POC OCCULT BLOOD, ED: Fecal Occult Bld: NEGATIVE

## 2018-12-08 MED ORDER — SODIUM CHLORIDE 0.9 % IV BOLUS
500.0000 mL | Freq: Once | INTRAVENOUS | Status: AC
  Administered 2018-12-08: 500 mL via INTRAVENOUS

## 2018-12-08 NOTE — ED Triage Notes (Signed)
Pt arrives via [facility] with c/o dizziness, sob and dark tarry stools since Thursday. Pt also endorses pain around his umbilicus. States he has had a gi bleed many years ago, denies chest pain, no blood thinners. A/ox4, resp e/u, nad.

## 2018-12-08 NOTE — Discharge Instructions (Addendum)
You were seen in the ER for lightheadedness, cough, diarrhea, abdominal pain  Work-up today was normal.  There was no blood in your stool.  Your hemoglobin and white blood cell count was normal.  Urine was normal and did not show infection  The cause of your symptoms is unclear but given that you have several symptoms this could just be a virus.  We tested you for COVID-19 illness however it could also be another virus like the flu or regular cold.  Test results of COVID-19 come back in 72 hours.  Treatment of your illness and symptoms will include self-isolation, monitoring of symptoms and supportive care with over-the-counter medicines.    Return to the ED if there is increased work of breathing, shortness of breath, inability to tolerate fluids, weakness, chest pain.  If your test results are POSITIVE, the following isolation requirements need to be met to return to work and resume essential activities: At least 14 days since symptom onset  72 hours of absence of fever without antifever medicine (ibuprofen, acetaminophen). A fever is temperature of 100.71F or greater. Improvement of respiratory symptoms  If your test is NEGATIVE, you may return to work and essential activities as long as your symptoms have improved and you do not have a fever for a total of 3 days.  Call your job and notify them that your test result was negative to see if they will allow you to return to work.   Stay well-hydrated. Rest. You can use over the counter medications to help with symptoms: 600 mg ibuprofen (motrin, aleve, advil) or acetaminophen (tylenol) every 6 hours, around the clock to help with associated fevers, sore throat, headaches, generalized body aches and malaise.  Oxymetazoline (afrin) intranasal spray once daily for no more than 3 days to help with congestion, after 3 days you can switch to another over-the-counter nasal steroid spray such as fluticasone (flonase) Allergy medication (loratadine,  cetirizine, etc) and phenylephrine (sudafed) help with nasal congestion, runny nose and postnasal drip.   Dextromethorphan (Delsym) to suppress dry cough. Frequent coughing is likely causing your chest wall pain Wash your hands often to prevent spread.  Stay hydrated Use home inhalers as needed   Infection Prevention Recommendations for Individuals Confirmed to have, or Being Evaluated for, or have symptoms of 2019 Novel Coronavirus (COVID-19) Infection Who Receive Care at Home  Individuals who are confirmed to have, or are being evaluated for, COVID-19 should follow the prevention steps below until a healthcare provider or local or state health department says they can return to normal activities.  Stay home except to get medical care You should restrict activities outside your home, except for getting medical care. Do not go to work, school, or public areas, and do not use public transportation or taxis.  Call ahead before visiting your doctor Before your medical appointment, call the healthcare provider and tell them that you have, or are being evaluated for, COVID-19 infection. This will help the healthcare providers office take steps to keep other people from getting infected. Ask your healthcare provider to call the local or state health department.  Monitor your symptoms Seek prompt medical attention if your illness is worsening (e.g., difficulty breathing). Before going to your medical appointment, call the healthcare provider and tell them that you have, or are being evaluated for, COVID-19 infection. Ask your healthcare provider to call the local or state health department.  Wear a facemask You should wear a facemask that covers your nose and mouth  when you are in the same room with other people and when you visit a healthcare provider. People who live with or visit you should also wear a facemask while they are in the same room with you.  Separate yourself from other people in  your home As much as possible, you should stay in a different room from other people in your home. Also, you should use a separate bathroom, if available.  Avoid sharing household items You should not share dishes, drinking glasses, cups, eating utensils, towels, bedding, or other items with other people in your home. After using these items, you should wash them thoroughly with soap and water.  Cover your coughs and sneezes Cover your mouth and nose with a tissue when you cough or sneeze, or you can cough or sneeze into your sleeve. Throw used tissues in a lined trash can, and immediately wash your hands with soap and water for at least 20 seconds or use an alcohol-based hand rub.  Wash your [facility] your hands often and thoroughly with soap and water for at least 20 seconds. You can use an alcohol-based hand sanitizer if soap and water are not available and if your hands are not visibly dirty. Avoid touching your eyes, nose, and mouth with unwashed hands.   Prevention Steps for Caregivers and Household Members of Individuals Confirmed to have, or Being Evaluated for, or have symptoms of 2019 Novel Coronavirus (COVID-19) Infection Being Cared for in the Home  If you live with, or provide care at home for, a person confirmed to have, or being evaluated for, COVID-19 infection please follow these guidelines to prevent infection:  Follow healthcare providers instructions Make sure that you understand and can help the patient follow any healthcare provider instructions for all care.  Provide for the patients basic needs You should help the patient with basic needs in the home and provide support for getting groceries, prescriptions, and other personal needs.  Monitor the patients symptoms If they are getting sicker, call his or her medical provider and tell them that the patient has, or is being evaluated for, COVID-19 infection. This will help the healthcare providers office take steps  to keep other people from getting infected. Ask the healthcare provider to call the local or state health department.  Limit the number of people who have contact with the patient If possible, have only one caregiver for the patient. Other household members should stay in another home or place of residence. If this is not possible, they should stay in another room, or be separated from the patient as much as possible. Use a separate bathroom, if available. Restrict visitors who do not have an essential need to be in the home.  Keep older adults, very young children, and other sick people away from the patient Keep older adults, very young children, and those who have compromised immune systems or chronic health conditions away from the patient. This includes people with chronic heart, lung, or kidney conditions, diabetes, and cancer.  Ensure good ventilation Make sure that shared spaces in the home have good air flow, such as from an air conditioner or an opened window, weather permitting.  Wash your hands often Wash your hands often and thoroughly with soap and water for at least 20 seconds. You can use an alcohol based hand sanitizer if soap and water are not available and if your hands are not visibly dirty. Avoid touching your eyes, nose, and mouth with unwashed hands. Use disposable paper  towels to dry your hands. If not available, use dedicated cloth towels and replace them when they become wet.  Wear a facemask and gloves Wear a disposable facemask at all times in the room and gloves when you touch or have contact with the patients blood, body fluids, and/or secretions or excretions, such as sweat, saliva, sputum, nasal mucus, vomit, urine, or feces.  Ensure the mask fits over your nose and mouth tightly, and do not touch it during use. Throw out disposable facemasks and gloves after using them. Do not reuse. Wash your hands immediately after removing your facemask and gloves. If your  personal clothing becomes contaminated, carefully remove clothing and launder. Wash your hands after handling contaminated clothing. Place all used disposable facemasks, gloves, and other waste in a lined container before disposing them with other household waste. Remove gloves and wash your hands immediately after handling these items.  Do not share dishes, glasses, or other household items with the patient Avoid sharing household items. You should not share dishes, drinking glasses, cups, eating utensils, towels, bedding, or other items with a patient who is confirmed to have, or being evaluated for, COVID-19 infection. After the person uses these items, you should wash them thoroughly with soap and water.  Wash laundry thoroughly Immediately remove and wash clothes or bedding that have blood, body fluids, and/or secretions or excretions, such as sweat, saliva, sputum, nasal mucus, vomit, urine, or feces, on them. Wear gloves when handling laundry from the patient. Read and follow directions on labels of laundry or clothing items and detergent. In general, wash and dry with the warmest temperatures recommended on the label.  Clean all areas the individual has used often Clean all touchable surfaces, such as counters, tabletops, doorknobs, bathroom fixtures, toilets, phones, keyboards, tablets, and bedside tables, every day. Also, clean any surfaces that may have blood, body fluids, and/or secretions or excretions on them. Wear gloves when cleaning surfaces the patient has come in contact with. Use a diluted bleach solution (e.g., dilute bleach with 1 part bleach and 10 parts water) or a household disinfectant with a label that says EPA-registered for coronaviruses. To make a bleach solution at home, add 1 tablespoon of bleach to 1 quart (4 cups) of water. For a larger supply, add  cup of bleach to 1 gallon (16 cups) of water. Read labels of cleaning products and follow recommendations provided on  product labels. Labels contain instructions for safe and effective use of the cleaning product including precautions you should take when applying the product, such as wearing gloves or eye protection and making sure you have good ventilation during use of the product. Remove gloves and wash hands immediately after cleaning.  Monitor yourself for signs and symptoms of illness Caregivers and household members are considered close contacts, should monitor their health, and will be asked to limit movement outside of the home to the extent possible. Follow the monitoring steps for close contacts listed on the symptom monitoring form.  ? If you have additional questions, contact your local health department or call the epidemiologist on call at [phone] (available 24/7). ? This guidance is subject to change. For the most up-to-date guidance from [facility], please refer to their website: [email or web address].pl

## 2018-12-08 NOTE — ED Notes (Signed)
Patient verbalizes understanding of discharge instructions. Opportunity for questioning and answers were provided. Pt discharged from ED. 

## 2018-12-08 NOTE — ED Provider Notes (Signed)
[place] EMERGENCY DEPARTMENT Provider Note   CSN: [id number] Arrival date & time: 12/08/18  0602     History   Chief Complaint Chief Complaint  Patient presents with   Dizziness   Rectal Bleeding    HPI Matthew Montes is a 58 y.o. male with h/o MDD, DM on insulin, chronic neck and back pain under pain clinic care, anemia with transfusions here for evaluation of light headedness described as feeling like he is going to fait, generalized weakness. Onset Thursday.  That night he developed watery diarrhea that was dark brown and intermittently looked like black tar, last episode midnight last night.  History of anemia taking iron supplements and infusions, last one 1 to 2 months ago.  He does not know exactly what type of anemia he has but he sees an oncologist who has told him his white and red blood cells are low.  Reports several years ago he had a GI bleed where he lost 80% of his blood and was hospitalized for 4 days but doctors never found a source.  He takes daily 81 mg aspirin and Tylenol every 8 hours as needed for pain.  No NSAIDs.  No alcohol use.  Over the last few days he has developed headaches, decreased appetite, cough that is mostly dry but twice has coughed up some phlegm, nausea and periumbilical abdominal pain.  For the last couple of weeks he has had intermittent discomfort with urination.  On Friday morning he woke up and his throat was irritated but he thinks it was from his CPAP machine.  Chronic sinus issues unchanged.  Smoked for 30 years but quit 6 to 7 years ago. Thinks he has COPD or asthma and uses inhalers as needed.  He took imodium last night around midnight.    Denies fever, hematuria.  No vomiting.  No associated chest pain, palpitations, passing out. No sick contacts. No travel.        HPI  Past Medical History:  Diagnosis Date   ADHD (attention deficit hyperactivity disorder)    Asthma    Cardiomegaly    Chronic back pain      Chronic neck pain    COPD (chronic obstructive pulmonary disease) (HCC)    Diabetes mellitus without complication (HCC)    Hypertension    Liver failure ([place])    Renal disorder    Renal insufficiency    Schizophrenia [facility])     Patient Active Problem List   Diagnosis Date Noted   Cellulitis of submandibular region 10/10/2015    Past Surgical History:  Procedure Laterality Date   BACK SURGERY     NECK SURGERY     SPINAL FUSION          Home Medications    Prior to Admission medications   Medication Sig Start Date End Date Taking? Authorizing Provider  acetaminophen (TYLENOL) 650 MG CR tablet Take 1,300 mg by mouth every 8 (eight) hours as needed for pain.    [provider]  Acetylcysteine 600 MG CAPS Take 600 mg by mouth 2 (two) times daily.    [provider]  aspirin EC 81 MG tablet Take 81 mg by mouth daily.     [provider]  atorvastatin (LIPITOR) 40 MG tablet Take 40 mg by mouth daily.    [provider]  baclofen (LIORESAL) 10 MG tablet Take 10 mg by mouth 2 (two) times daily as needed for muscle spasms.    [provider]  diclofenac sodium (VOLTAREN) 1 % GEL Apply 2 g topically 4 (four) times daily.    [provider]  esomeprazole (NEXIUM) 40 MG capsule Take 40 mg by mouth daily before breakfast. 10/07/15   [provider]  ferrous gluconate (FERGON) 324 MG tablet Take 324 mg by mouth daily with breakfast.    [provider]  fluocinonide cream (LIDEX) 0.05 % Apply 1 application topically 2 (two) times daily as needed (for itching).    [provider]  FLUoxetine (PROZAC) 20 MG capsule Take 60 mg by mouth daily.    [provider]  gabapentin (NEURONTIN) 300 MG capsule Take 900 mg by mouth 3 (three) times daily. 05/07/18   [provider]  LANTUS SOLOSTAR 100 UNIT/ML Solostar Pen Inject 10-40 Units into the skin See admin instructions. Use 10 units every  morning and use 40 units every evening 10/07/15   [provider]  liraglutide (VICTOZA) 18 MG/3ML SOPN Inject 1.8 mg into the skin daily.    [provider]  lisinopril (PRINIVIL,ZESTRIL) 10 MG tablet Take 10 mg by mouth daily.    [provider]  loperamide (IMODIUM A-D) 2 MG tablet Take 1 tablet (2 mg total) by mouth 4 (four) times daily as needed for diarrhea or loose stools. Patient not taking: Reported on 03/13/2018 06/19/16   Rockne Menghini, MD  loperamide (IMODIUM) 2 MG capsule Take 2 mg by mouth 4 (four) times daily as needed for diarrhea or loose stools.    [provider]  Magnesium 250 MG TABS Take 750 mg by mouth daily.    [provider]  meclizine (ANTIVERT) 25 MG tablet Take 1 tablet (25 mg total) by mouth 3 (three) times daily as needed for dizziness or nausea. 07/03/16   Irean Hong, MD  Melatonin 3 MG TABS Take 9 mg by mouth at bedtime.    [provider]  metFORMIN (GLUCOPHAGE-XR) 500 MG 24 hr tablet Take 1,000 mg by mouth 2 (two) times daily.    [provider]  metoprolol succinate (TOPROL-XL) 50 MG 24 hr tablet Take 50 mg by mouth daily.  10/07/15   [provider]  Multiple Vitamin (MULTIVITAMIN WITH MINERALS) TABS tablet Take 1 tablet by mouth daily.    [provider]  mupirocin ointment (BACTROBAN) 2 % Apply 1 application topically 2 (two) times daily as needed (skin care).     [provider]  ondansetron (ZOFRAN ODT) 8 MG disintegrating tablet Take 1 tablet (8 mg total) by mouth every 8 (eight) hours as needed for nausea or vomiting. 05/12/18   Azalia Bilis, MD  ondansetron (ZOFRAN) 4 MG tablet Take 4 mg by mouth every 8 (eight) hours as needed for nausea or vomiting.    [provider]  predniSONE (DELTASONE) 10 MG tablet Label  & dispense according to the schedule below. 5 Pills PO for 1 day then, 4 Pills PO for 1 day, 3 Pills PO for 1 day, 2 Pills PO for 1 day, 1 Pill PO  for 1 days then STOP. Patient not taking: Reported on 11/01/2015 10/11/15   Houston Siren, MD  QUEtiapine (SEROQUEL) 200 MG tablet Take 400 mg by mouth at bedtime.    [provider]  sodium chloride (OCEAN) 0.65 % SOLN nasal spray Place 1 spray into both nostrils as needed for congestion. 04/22/18   Georgetta Haber, NP  tamsulosin (FLOMAX) 0.4 MG CAPS capsule Take 0.8 mg by mouth daily.  10/07/15  [provider]  traMADol (ULTRAM) 50 MG tablet Take 50 mg by mouth every 4 (four) hours as needed for moderate pain.  09/10/15   [provider]  vitamin B-12 (CYANOCOBALAMIN) 1000 MCG tablet Take 1,000 mcg by mouth daily.    [provider]    Family History Family History  Problem Relation Age of Onset   Heart attack Mother    Heart disease Father    Diabetes Father     Social History Social History   Tobacco Use   Smoking status: Former Smoker   Smokeless tobacco: Current User    Types: Chew  Substance Use Topics   Alcohol use: Yes    Comment: 3 drinks/month.    Drug use: Not on file     Allergies   Patient has no known allergies.   Review of Systems Review of Systems  Constitutional: Positive for appetite change and fatigue.  HENT: Positive for congestion (Chronic sinus issues) and sore throat (Resolved).   Respiratory: Positive for shortness of breath.   Gastrointestinal: Positive for abdominal pain, blood in stool, diarrhea and nausea.  Genitourinary: Positive for dysuria.  Musculoskeletal: Positive for back pain (Chronic).  Neurological: Positive for light-headedness and headaches.  All other systems reviewed and are negative.    Physical Exam Updated Vital Signs BP (!) 140/96    Pulse 86    Temp 98.5 F (36.9 C) (Oral)    Resp 15    SpO2 96%   Physical Exam Vitals signs and nursing note reviewed.  Constitutional:      Appearance: He is well-developed.     Comments: Non toxic.  HENT:     Head: Normocephalic and  atraumatic.     Nose: Nose normal.     Mouth/Throat:     Comments: Moist mucous membranes Eyes:     Conjunctiva/sclera: Conjunctivae normal.     Comments: No conjunctival pallor  Neck:     Musculoskeletal: Normal range of motion.  Cardiovascular:     Rate and Rhythm: Normal rate and regular rhythm.     Heart sounds: Normal heart sounds.     Comments: 1+ radial and DP pulses bilaterally.  No lower extremity edema.  No calf tenderness Pulmonary:     Effort: Pulmonary effort is normal.     Breath sounds: Normal breath sounds.  Abdominal:     General: Bowel sounds are normal.     Palpations: Abdomen is soft.     Tenderness: There is abdominal tenderness.     Comments: Obese, soft.  Mild periumbilical abdominal tenderness but no G/R/R. No suprapubic or CVA tenderness. Negative Murphy's and McBurney's  Genitourinary:    Comments:  RN present during exam.  Stool is brown without melena. Perianal skin normal without hemorrhoids, fissures.  Good rectal tone.  Musculoskeletal: Normal range of motion.  Skin:    General: Skin is warm and dry.     Capillary Refill: Capillary refill takes less than 2 seconds.  Neurological:     Mental Status: He is alert and oriented to person, place, and time.     Comments: Speech is clear.  Sits on side of the bed without any truncal sway and cannot stand up without any difficulty.  Psychiatric:        Behavior: Behavior normal.      ED Treatments / Results  Labs (all labs ordered are listed, but only abnormal results are displayed) Labs Reviewed  COMPREHENSIVE METABOLIC PANEL - Abnormal; Notable for the following  components:      Result Value   Glucose, Bld 225 (*)    Total Protein 5.9 (*)    All other components within normal limits  CBC - Abnormal; Notable for the following components:   RBC 3.76 (*)    Hemoglobin 12.5 (*)    HCT 36.9 (*)    All other components within normal limits  URINALYSIS, ROUTINE W REFLEX MICROSCOPIC - Abnormal;  Notable for the following components:   Glucose, UA 150 (*)    All other components within normal limits  URINE CULTURE  NOVEL CORONAVIRUS, NAA (HOSP ORDER, SEND-OUT TO REF LAB; TAT 18-24 HRS)  POC OCCULT BLOOD, ED  TYPE AND SCREEN    EKG EKG Interpretation  Date/Time:  Saturday December 08 2018 06:09:51 EDT Ventricular Rate:  84 PR Interval:  154 QRS Duration: 84 QT Interval:  362 QTC Calculation: 427 R Axis:   75 Text Interpretation:  Normal sinus rhythm Normal ECG no change Confirmed by Arby Barrette [phone]) on 12/08/2018 9:17:14 AM   Radiology Dg Chest Portable 1 View  Result Date: 12/08/2018 CLINICAL DATA:  Cough and diarrhea.  Shortness of breath EXAM: PORTABLE CHEST 1 VIEW COMPARISON:  May 12, 2018 FINDINGS: There is no edema or consolidation. Heart is upper normal in size with pulmonary vascularity normal. No adenopathy. There is postoperative change in the lower cervical region. IMPRESSION: No edema or consolidation. Heart upper normal in size. No evident adenopathy. Electronically Signed   By: Bretta Bang III M.D.   On: 12/08/2018 08:36    Procedures Procedures (including critical care time)  Medications Ordered in ED Medications  sodium chloride 0.9 % bolus 500 mL (0 mLs Intravenous Stopped 12/08/18 0913)     Initial Impression / Assessment and Plan / ED Course  I have reviewed the triage vital signs and the nursing notes.  Pertinent labs & imaging results that were available during my care of the patient were reviewed by me and considered in my medical decision making (see chart for details).  EMR reviewed.  58 year old here with lightheadedness and several other complaints including headaches, resolved sore throat, cough with intermittent sputum, shortness of breath, periumbilical abdominal pain, diarrhea, dysuria.  Reports history of GI bleed.  Exam benign.  No significant signs of dehydration.  Mild umbilical tenderness without peritonitis.  No  suprapubic or CVA tenderness.  Vital signs normal.  Highest on DDX is viral process given several systems involved.  He reports diarrhea that is brown but also black, takes aspirin reports significant GI bleed several years ago so this could be causing his lightheadedness.  He has had dysuria for 2 weeks so we will get a urine sample.  Reports a mild cough in setting of heavy remote tobacco use, COPD likely viral process versus mild COPD exacerbation versus bronchospasm versus virus.  We will add a chest x-ray.  He is in no respiratory distress and his lungs are normal.  No chest pain, fevers.  0915: ER work-up reviewed by me is vastly reassuring.  Hyperglycemia without DKA or HHS.  Hemoglobin is 12.5.  Hemoccult with brown stool and negative for blood.  UA with glucosuria but no infection.  Chest x-ray is negative without infiltrates, opacities, edema, cardiomegaly.  EKG is nonischemic without arrhythmias.  Vital signs have remained normal.  He is ambulatory in the ER with any symptoms including lightheadedness or shortness of breath.  He has received IV fluids.  Given reassuring exam, work-up and several systems involved suspect this  may be viral process versus mild dehydration.  Will send COVID-19 test.  Symptomatic management encouraged.  He has follow-up with pain clinic and oncologist this upcoming week.  Return precautions discussed.  Patient is comfortable with this. Final Clinical Impressions(s) / ED Diagnoses   Final diagnoses:  Light headedness  Diarrhea of presumed infectious origin  Cough    ED Discharge Orders    None       Liberty HandyGibbons, Nandi Tonnesen J, PA-C 12/08/18 [id number]    Arby BarrettePfeiffer, Marcy, MD 12/08/18 1327

## 2018-12-09 LAB — URINE CULTURE: Culture: <10000 — AB

## 2018-12-09 LAB — NOVEL CORONAVIRUS, NAA (HOSP ORDER, SEND-OUT TO REF LAB; TAT 18-24 HRS): SARS-CoV-2, NAA: NOT DETECTED

## 2018-12-10 ENCOUNTER — Emergency Department (HOSPITAL_COMMUNITY): Payer: Medicare Other

## 2018-12-10 ENCOUNTER — Emergency Department (HOSPITAL_COMMUNITY)
Admission: EM | Admit: 2018-12-10 | Discharge: 2018-12-10 | Disposition: A | Discharge status: Home / Self Care | Payer: Medicare Other | Attending: Emergency Medicine | Admitting: Emergency Medicine

## 2018-12-10 ENCOUNTER — Encounter (HOSPITAL_COMMUNITY): Payer: Self-pay | Admitting: Emergency Medicine

## 2018-12-10 ENCOUNTER — Other Ambulatory Visit: Payer: Self-pay

## 2018-12-10 DIAGNOSIS — Z7984 Long term (current) use of oral hypoglycemic drugs: Secondary | ICD-10-CM | POA: Insufficient documentation

## 2018-12-10 DIAGNOSIS — J45909 Unspecified asthma, uncomplicated: Secondary | ICD-10-CM | POA: Insufficient documentation

## 2018-12-10 DIAGNOSIS — F17220 Nicotine dependence, chewing tobacco, uncomplicated: Secondary | ICD-10-CM | POA: Diagnosis not present

## 2018-12-10 DIAGNOSIS — R0602 Shortness of breath: Secondary | ICD-10-CM | POA: Insufficient documentation

## 2018-12-10 DIAGNOSIS — Z7982 Long term (current) use of aspirin: Secondary | ICD-10-CM | POA: Diagnosis not present

## 2018-12-10 DIAGNOSIS — J449 Chronic obstructive pulmonary disease, unspecified: Secondary | ICD-10-CM | POA: Diagnosis not present

## 2018-12-10 DIAGNOSIS — R110 Nausea: Secondary | ICD-10-CM | POA: Insufficient documentation

## 2018-12-10 DIAGNOSIS — I10 Essential (primary) hypertension: Secondary | ICD-10-CM | POA: Insufficient documentation

## 2018-12-10 DIAGNOSIS — E119 Type 2 diabetes mellitus without complications: Secondary | ICD-10-CM | POA: Insufficient documentation

## 2018-12-10 DIAGNOSIS — Z79899 Other long term (current) drug therapy: Secondary | ICD-10-CM | POA: Diagnosis not present

## 2018-12-10 DIAGNOSIS — R42 Dizziness and giddiness: Secondary | ICD-10-CM | POA: Insufficient documentation

## 2018-12-10 LAB — BASIC METABOLIC PANEL
Anion gap: 11 (ref 5–15)
BUN: 8 mg/dL (ref 6–20)
CO2: 23 mmol/L (ref 22–32)
Calcium: 8.9 mg/dL (ref 8.9–10.3)
Chloride: 97 mmol/L — ABNORMAL LOW (ref 98–111)
Creatinine, Ser: 0.72 mg/dL (ref 0.61–1.24)
GFR calc Af Amer: >60 mL/min (ref >60)
GFR calc non Af Amer: >60 mL/min (ref >60)
Glucose, Bld: 212 mg/dL — ABNORMAL HIGH (ref 70–99)
Potassium: 4 mmol/L (ref 3.5–5.1)
Sodium: 131 mmol/L — ABNORMAL LOW (ref 135–145)

## 2018-12-10 LAB — CBC
HCT: 35.5 % — ABNORMAL LOW (ref 39.0–52.0)
Hemoglobin: 12.5 g/dL — ABNORMAL LOW (ref 13.0–17.0)
MCH: 33.3 pg (ref 26.0–34.0)
MCHC: 35.2 g/dL (ref 30.0–36.0)
MCV: 94.7 fL (ref 80.0–100.0)
Platelets: 260 10*3/uL (ref 150–400)
RBC: 3.75 MIL/uL — ABNORMAL LOW (ref 4.22–5.81)
RDW: 12 % (ref 11.5–15.5)
WBC: 7 10*3/uL (ref 4.0–10.5)
nRBC: 0 % (ref 0.0–0.2)

## 2018-12-10 LAB — TROPONIN I (HIGH SENSITIVITY)
Troponin I (High Sensitivity): 2 ng/L (ref <18)
Troponin I (High Sensitivity): 6 ng/L (ref <18)

## 2018-12-10 MED ORDER — MECLIZINE HCL 25 MG PO TABS: 25.0000 mg | ORAL_TABLET | Freq: Three times a day (TID) | ORAL | 0 refills | Status: DC | PRN

## 2018-12-10 MED ORDER — SODIUM CHLORIDE 0.9% FLUSH
3.0000 mL | Freq: Once | INTRAVENOUS | Status: AC
  Administered 2018-12-10: 3 mL via INTRAVENOUS

## 2018-12-10 MED ORDER — MECLIZINE HCL 25 MG PO TABS
25.0000 mg | ORAL_TABLET | Freq: Once | ORAL | Status: AC
  Administered 2018-12-10: 25 mg via ORAL
  Filled 2018-12-10: qty 1

## 2018-12-10 NOTE — ED Notes (Signed)
Pt returns from MRI and is moved to hallway bed.

## 2018-12-10 NOTE — ED Notes (Signed)
Pt eating lunch tray and tolerating well

## 2018-12-10 NOTE — ED Triage Notes (Signed)
PT to ED via [facility]> C/o dizziness, nausea, vomiting, and SOB since Saturday.  States he was seen in ED for same and COVID tested.  Denies pain.  No neuro deficits noted on triage exam.

## 2018-12-10 NOTE — ED Notes (Signed)
Patient verbalizes understanding of discharge instructions. Opportunity for questioning and answers were provided.  pt discharged from ED. Ambulatory by self. Patient called his cab.

## 2018-12-10 NOTE — ED Provider Notes (Addendum)
[facility] Provider Note   CSN: [id number] Arrival date & time: 12/10/18  0056     History   Chief Complaint Chief Complaint  Patient presents with  . Dizziness  . Shortness of Breath  . Nausea    HPI Waldemar Dickenshomas R Wolk is a 58 y.o. male.     Patient was seen October 24 had Covid testing done which was negative.  Here today for vertigo nausea and vomiting has had some baseline shortness of breath since Saturday.  But he did not have the vertigo when he was seen on the 24th.  Said he had intense room spinning.  Patient denied any prior history of it but has seen that he was on antivert medication in 2018.  Patient denies any visual changes headache arms or legs not working.     Past Medical History:  Diagnosis Date  . ADHD (attention deficit hyperactivity disorder)   . Asthma   . Cardiomegaly   . Chronic back pain   . Chronic neck pain   . COPD (chronic obstructive pulmonary disease) (HCC)   . Diabetes mellitus without complication (HCC)   . Hypertension   . Liver failure (HCC)   . Renal disorder   . Renal insufficiency   . Schizophrenia [facility](HCC)     Patient Active Problem List   Diagnosis Date Noted  . Cellulitis of submandibular region 10/10/2015    Past Surgical History:  Procedure Laterality Date  . BACK SURGERY    . NECK SURGERY    . SPINAL FUSION          Home Medications    Prior to Admission medications   Medication Sig Start Date End Date Taking? Authorizing Provider  acetaminophen (TYLENOL) 650 MG CR tablet Take 1,300 mg by mouth every 8 (eight) hours as needed for pain.    [provider]  Acetylcysteine 600 MG CAPS Take 600 mg by mouth 2 (two) times daily.    [provider]  aspirin EC 81 MG tablet Take 81 mg by mouth daily.     [provider]  atorvastatin (LIPITOR) 40 MG tablet Take 40 mg by mouth daily.    [provider]  baclofen (LIORESAL) 10 MG tablet Take 10 mg  by mouth 2 (two) times daily as needed for muscle spasms.    [provider]  diclofenac sodium (VOLTAREN) 1 % GEL Apply 2 g topically 4 (four) times daily.    [provider]  esomeprazole (NEXIUM) 40 MG capsule Take 40 mg by mouth daily before breakfast. 10/07/15   [provider]  ferrous gluconate (FERGON) 324 MG tablet Take 324 mg by mouth daily with breakfast.    [provider]  fluocinonide cream (LIDEX) 0.05 % Apply 1 application topically 2 (two) times daily as needed (for itching).    [provider]  FLUoxetine (PROZAC) 20 MG capsule Take 60 mg by mouth daily.    [provider]  gabapentin (NEURONTIN) 300 MG capsule Take 900 mg by mouth 3 (three) times daily. 05/07/18   [provider]  LANTUS SOLOSTAR 100 UNIT/ML Solostar Pen Inject 10-40 Units into the skin See admin instructions. Use 10 units every morning and use 40 units every evening 10/07/15   [provider]  liraglutide (VICTOZA) 18 MG/3ML SOPN Inject 1.8 mg into the skin daily.    [provider]  lisinopril (PRINIVIL,ZESTRIL) 10 MG tablet Take 10 mg by mouth daily.    [provider]  loperamide (IMODIUM A-D) 2 MG tablet Take 1 tablet (2 mg total) by mouth 4 (four) times daily as needed for diarrhea or loose stools. Patient not taking: Reported on 03/13/2018 06/19/16   Rockne Menghini, MD  loperamide (IMODIUM) 2 MG capsule Take 2 mg by mouth 4 (four) times daily as needed for diarrhea or loose stools.    [provider]  Magnesium 250 MG TABS Take 750 mg by mouth daily.    [provider]  meclizine (ANTIVERT) 25 MG tablet Take 1 tablet (25 mg total) by mouth 3 (three) times daily as needed for dizziness or nausea. 07/03/16   Irean Hong, MD  meclizine (ANTIVERT) 25 MG tablet Take 1 tablet (25 mg total) by mouth 3 (three) times daily as needed for dizziness. 12/10/18   Vanetta Mulders, MD  Melatonin 3 MG TABS Take 9 mg  by mouth at bedtime.    [provider]  metFORMIN (GLUCOPHAGE-XR) 500 MG 24 hr tablet Take 1,000 mg by mouth 2 (two) times daily.    [provider]  metoprolol succinate (TOPROL-XL) 50 MG 24 hr tablet Take 50 mg by mouth daily.  10/07/15   [provider]  Multiple Vitamin (MULTIVITAMIN WITH MINERALS) TABS tablet Take 1 tablet by mouth daily.    [provider]  mupirocin ointment (BACTROBAN) 2 % Apply 1 application topically 2 (two) times daily as needed (skin care).     [provider]  ondansetron (ZOFRAN ODT) 8 MG disintegrating tablet Take 1 tablet (8 mg total) by mouth every 8 (eight) hours as needed for nausea or vomiting. 05/12/18   Azalia Bilis, MD  ondansetron (ZOFRAN) 4 MG tablet Take 4 mg by mouth every 8 (eight) hours as needed for nausea or vomiting.    [provider]  predniSONE (DELTASONE) 10 MG tablet Label  & dispense according to the schedule below. 5 Pills PO for 1 day then, 4 Pills PO for 1 day, 3 Pills PO for 1 day, 2 Pills PO for 1 day, 1 Pill PO for 1 days then STOP. Patient not taking: Reported on 11/01/2015 10/11/15   Houston Siren, MD  QUEtiapine (SEROQUEL) 200 MG tablet Take 400 mg by mouth at bedtime.    [provider]  sodium chloride (OCEAN) 0.65 % SOLN nasal spray Place 1 spray into both nostrils as needed for congestion. 04/22/18   Georgetta Haber, NP  tamsulosin (FLOMAX) 0.4 MG CAPS capsule Take 0.8 mg by mouth daily.  10/07/15   [provider]  traMADol (ULTRAM) 50 MG tablet Take 50 mg by mouth every 4 (four) hours as needed for moderate pain.  09/10/15   [provider]  vitamin B-12 (CYANOCOBALAMIN) 1000 MCG tablet Take 1,000 mcg by mouth daily.    [provider]    Family History Family History  Problem Relation Age of Onset  . Heart attack Mother   . Heart disease Father   . Diabetes Father     Social History Social History   Tobacco Use  . Smoking status:  Former Games developer  . Smokeless tobacco: Current User    Types: Chew  Substance Use Topics  . Alcohol use: Yes    Comment: 3 drinks/month.   . Drug use: Not on file     Allergies   Patient has no known allergies.   Review of Systems Review of Systems  Constitutional: Negative for chills and fever.  HENT: Negative for congestion, rhinorrhea and  sore throat.   Eyes: Negative for visual disturbance.  Respiratory: Negative for cough and shortness of breath.   Cardiovascular: Negative for chest pain and leg swelling.  Gastrointestinal: Positive for nausea and vomiting. Negative for abdominal pain and diarrhea.  Genitourinary: Negative for dysuria.  Musculoskeletal: Negative for back pain and neck pain.  Skin: Negative for rash.  Neurological: Positive for dizziness. Negative for speech difficulty, weakness, light-headedness, numbness and headaches.  Hematological: Does not bruise/bleed easily.  Psychiatric/Behavioral: Negative for confusion.     Physical Exam Updated Vital Signs BP (!) 148/85 (BP Location: Left Arm)   Pulse 83   Temp 97.9 F (36.6 C) (Oral)   Resp 18   Ht 1.803 m (5\' 11" )   Wt 104.3 kg   SpO2 98%   BMI 32.08 kg/m   Physical Exam Vitals signs and nursing note reviewed.  Constitutional:      Appearance: Normal appearance. He is well-developed.  HENT:     Head: Normocephalic and atraumatic.  Eyes:     Conjunctiva/sclera: Conjunctivae normal.     Pupils: Pupils are equal, round, and reactive to light.  Neck:     Musculoskeletal: Neck supple.  Cardiovascular:     Rate and Rhythm: Normal rate and regular rhythm.     Heart sounds: No murmur.  Pulmonary:     Effort: Pulmonary effort is normal. No respiratory distress.     Breath sounds: Normal breath sounds.  Abdominal:     Palpations: Abdomen is soft.     Tenderness: There is no abdominal tenderness.  Musculoskeletal: Normal range of motion.        General: No swelling.  Skin:    General: Skin is  warm and dry.     Capillary Refill: Capillary refill takes less than 2 seconds.  Neurological:     General: No focal deficit present.     Mental Status: He is alert and oriented to person, place, and time.     Cranial Nerves: No cranial nerve deficit.     Sensory: No sensory deficit.     Motor: No weakness.      ED Treatments / Results  Labs (all labs ordered are listed, but only abnormal results are displayed) Labs Reviewed  BASIC METABOLIC PANEL - Abnormal; Notable for the following components:      Result Value   Sodium 131 (*)    Chloride 97 (*)    Glucose, Bld 212 (*)    All other components within normal limits  CBC - Abnormal; Notable for the following components:   RBC 3.75 (*)    Hemoglobin 12.5 (*)    HCT 35.5 (*)    All other components within normal limits  TROPONIN I (HIGH SENSITIVITY)  TROPONIN I (HIGH SENSITIVITY)    EKG EKG Interpretation  Date/Time:  Monday December 10 2018 01:13:33 EDT Ventricular Rate:  84 PR Interval:  152 QRS Duration: 78 QT Interval:  340 QTC Calculation: 401 R Axis:   67 Text Interpretation:  Normal sinus rhythm Normal ECG When compared with ECG of 12/08/2018, No significant change was found Confirmed by 12/10/2018 (Dione Booze) on 12/10/2018 3:55:17 AM   Radiology Dg Chest 2 View  Result Date: 12/10/2018 CLINICAL DATA:  Shortness of breath, COVID negative. EXAM: CHEST - 2 VIEW COMPARISON:  12/08/2018. FINDINGS: Apical lordotic view. Trachea is midline. Heart is enlarged. Lungs are clear. No pleural fluid. Degenerative changes in the spine. IMPRESSION: No acute findings. Electronically Signed   By: 12/10/2018  M.D.   On: 12/10/2018 10:40   Mr Brain Wo Contrast (neuro Protocol)  Result Date: 12/10/2018 CLINICAL DATA:  Ataxia, stroke suspected. Additional history provided: Dizziness, nausea, vomiting and shortness of breath since Saturday. EXAM: MRI HEAD WITHOUT CONTRAST TECHNIQUE: Multiplanar, multiecho pulse sequences of the  brain and surrounding structures were obtained without intravenous contrast. COMPARISON:  Head CT examinations 03/13/2018 and earlier FINDINGS: Brain: There is no convincing evidence of acute infarct. No evidence of intracranial mass. No midline shift or extra-axial fluid collection. No chronic intracranial blood products. No significant white matter disease for age. Cerebral volume is normal for age. Vascular: Flow voids maintained within the proximal large arterial vessels. Skull and upper cervical spine: No focal marrow lesion Sinuses/Orbits: Visualized orbits demonstrate no acute abnormality. Mild ethmoid sinus mucosal thickening. Trace fluid within bilateral mastoid air cells. IMPRESSION: 1. No evidence of acute intracranial abnormality, including acute infarct. 2. Normal MRI appearance of the brain for age. 3. Trace bilateral mastoid effusions. Electronically Signed   By: Kellie Simmering DO   On: 12/10/2018 12:58    Procedures Procedures (including critical care time)  Medications Ordered in ED Medications  sodium chloride flush (NS) 0.9 % injection 3 mL (3 mLs Intravenous Given 12/10/18 1011)  meclizine (ANTIVERT) tablet 25 mg (25 mg Oral Given 12/10/18 1009)     Initial Impression / Assessment and Plan / ED Course  I have reviewed the triage vital signs and the nursing notes.  Pertinent labs & imaging results that were available during my care of the patient were reviewed by me and considered in my medical decision making (see chart for details).        Went right to ordering MRI based on his vertigo symptoms to rule out stroke.  MRI negative.  Patient given Antivert.  With some improvement.  No other significant abnormalities.  Labs other than some mild hyperglycemia and some mild hyponatremia patient given referral to ear nose and throat.  He states he is followed by the [facility] system and he does have an ear nose and throat specialist.  Patient discharged home on [place].  In addition his  Covid testing from 2 days ago was negative.  Final Clinical Impressions(s) / ED Diagnoses   Final diagnoses:  Vertigo    ED Discharge Orders         Ordered    meclizine (ANTIVERT) 25 MG tablet  3 times daily PRN     12/10/18 1618           Fredia Sorrow, MD 12/10/18 1628    Fredia Sorrow, MD 12/10/18 1714

## 2018-12-10 NOTE — ED Notes (Signed)
Patient transported to X-ray 

## 2018-12-10 NOTE — Discharge Instructions (Signed)
MRI of the brain showed no evidence of any stroke.  Take the Antivert as directed.  Your Covid test was negative from the other day.  Also make an appointment to follow-up with ear nose and throat.  They will evaluate the ears as a possible cause for the vertigo.  Return for any new or worse symptoms.

## 2018-12-10 NOTE — ED Notes (Signed)
Patient transported to MRI 

## 2018-12-12 ENCOUNTER — Other Ambulatory Visit: Payer: Self-pay

## 2018-12-12 ENCOUNTER — Encounter (HOSPITAL_COMMUNITY): Payer: Self-pay | Admitting: Emergency Medicine

## 2018-12-12 ENCOUNTER — Ambulatory Visit (HOSPITAL_COMMUNITY)
Admission: EM | Admit: 2018-12-12 | Discharge: 2018-12-12 | Disposition: A | Discharge status: Home / Self Care | Payer: Medicare Other

## 2018-12-12 DIAGNOSIS — R42 Dizziness and giddiness: Secondary | ICD-10-CM

## 2018-12-12 DIAGNOSIS — H6981 Other specified disorders of Eustachian tube, right ear: Secondary | ICD-10-CM

## 2018-12-12 DIAGNOSIS — H6991 Unspecified Eustachian tube disorder, right ear: Secondary | ICD-10-CM

## 2018-12-12 MED ORDER — FLUTICASONE PROPIONATE 50 MCG/ACT NA SUSP: 1.0000 | Freq: Every day | NASAL | 2 refills | Status: AC

## 2018-12-12 MED ORDER — MECLIZINE HCL 25 MG PO TABS: 25.0000 mg | ORAL_TABLET | Freq: Three times a day (TID) | ORAL | 0 refills | Status: AC | PRN

## 2018-12-12 NOTE — ED Provider Notes (Signed)
[facility]    CSN: [id number] Arrival date & time: 12/12/18  0831      History   Chief Complaint Chief Complaint  Patient presents with  . Dizziness    HPI LEDARIUS LEESON is a 58 y.o. male.   Patient is a 58 year old male past medical history of ADHD, asthma, cardiomegaly, chronic back pain, chronic neck pain, COPD, diabetes, hypertension, liver failure, renal disorder, schizophrenia.  He presents today with vertigo. Symptoms have been constant, waxing and waning.  He has been seen twice for similar complaints in the emergency room in the past 4 days.  He has had full work-up to include EKG, MRI and blood work which was all within normal range.  Was diagnosed with vertigo and told to take meclizine.  He also tested negative for Covid.  Had some right ear discomfort and cough with sinus congestion prior to this all starting.  No fever, chills, aches or night sweats.  Mild headache without blurred vision.  No nausea or vomiting.  ROS per HPI      Past Medical History:  Diagnosis Date  . ADHD (attention deficit hyperactivity disorder)   . Asthma   . Cardiomegaly   . Chronic back pain   . Chronic neck pain   . COPD (chronic obstructive pulmonary disease) (HCC)   . Diabetes mellitus without complication (HCC)   . Hypertension   . Liver failure (HCC)   . Renal disorder   . Renal insufficiency   . Schizophrenia [facility])     Patient Active Problem List   Diagnosis Date Noted  . Cellulitis of submandibular region 10/10/2015    Past Surgical History:  Procedure Laterality Date  . BACK SURGERY    . NECK SURGERY    . SPINAL FUSION         Home Medications    Prior to Admission medications   Medication Sig Start Date End Date Taking? Authorizing Provider  oxyCODONE-acetaminophen (PERCOCET/ROXICET) 5-325 MG tablet Take by mouth every 4 (four) hours as needed for severe pain.   Yes [provider]  acetaminophen (TYLENOL) 650 MG CR tablet Take 1,300  mg by mouth every 8 (eight) hours as needed for pain.    [provider]  Acetylcysteine 600 MG CAPS Take 600 mg by mouth 2 (two) times daily.    [provider]  aspirin EC 81 MG tablet Take 81 mg by mouth daily.     [provider]  atorvastatin (LIPITOR) 40 MG tablet Take 40 mg by mouth daily.    [provider]  baclofen (LIORESAL) 10 MG tablet Take 10 mg by mouth 2 (two) times daily as needed for muscle spasms.    [provider]  diclofenac sodium (VOLTAREN) 1 % GEL Apply 2 g topically 4 (four) times daily.    [provider]  esomeprazole (NEXIUM) 40 MG capsule Take 40 mg by mouth daily before breakfast. 10/07/15   [provider]  ferrous gluconate (FERGON) 324 MG tablet Take 324 mg by mouth daily with breakfast.    [provider]  fluocinonide cream (LIDEX) 0.05 % Apply 1 application topically 2 (two) times daily as needed (for itching).    [provider]  FLUoxetine (PROZAC) 20 MG capsule Take 60 mg by mouth daily.    [provider]  fluticasone (FLONASE) 50 MCG/ACT nasal spray Place 1 spray into both nostrils daily. 12/12/18   Dahlia Byes A, NP  gabapentin (NEURONTIN) 300 MG capsule Take  900 mg by mouth 3 (three) times daily. 05/07/18   [provider]  LANTUS SOLOSTAR 100 UNIT/ML Solostar Pen Inject 10-40 Units into the skin See admin instructions. Use 10 units every morning and use 40 units every evening 10/07/15   [provider]  liraglutide (VICTOZA) 18 MG/3ML SOPN Inject 1.8 mg into the skin daily.    [provider]  lisinopril (PRINIVIL,ZESTRIL) 10 MG tablet Take 10 mg by mouth daily.    [provider]  loperamide (IMODIUM A-D) 2 MG tablet Take 1 tablet (2 mg total) by mouth 4 (four) times daily as needed for diarrhea or loose stools. Patient not taking: Reported on 03/13/2018 06/19/16   Rockne Menghini, MD  loperamide (IMODIUM) 2 MG capsule Take 2 mg  by mouth 4 (four) times daily as needed for diarrhea or loose stools.    [provider]  Magnesium 250 MG TABS Take 750 mg by mouth daily.    [provider]  meclizine (ANTIVERT) 25 MG tablet Take 1 tablet (25 mg total) by mouth 3 (three) times daily as needed for dizziness. 12/12/18   Dahlia Byes A, NP  Melatonin 3 MG TABS Take 9 mg by mouth at bedtime.    [provider]  metFORMIN (GLUCOPHAGE-XR) 500 MG 24 hr tablet Take 1,000 mg by mouth 2 (two) times daily.    [provider]  metoprolol succinate (TOPROL-XL) 50 MG 24 hr tablet Take 50 mg by mouth daily.  10/07/15   [provider]  Multiple Vitamin (MULTIVITAMIN WITH MINERALS) TABS tablet Take 1 tablet by mouth daily.    [provider]  mupirocin ointment (BACTROBAN) 2 % Apply 1 application topically 2 (two) times daily as needed (skin care).     [provider]  ondansetron (ZOFRAN ODT) 8 MG disintegrating tablet Take 1 tablet (8 mg total) by mouth every 8 (eight) hours as needed for nausea or vomiting. 05/12/18   Azalia Bilis, MD  ondansetron (ZOFRAN) 4 MG tablet Take 4 mg by mouth every 8 (eight) hours as needed for nausea or vomiting.    [provider]  predniSONE (DELTASONE) 10 MG tablet Label  & dispense according to the schedule below. 5 Pills PO for 1 day then, 4 Pills PO for 1 day, 3 Pills PO for 1 day, 2 Pills PO for 1 day, 1 Pill PO for 1 days then STOP. Patient not taking: Reported on 11/01/2015 10/11/15   Houston Siren, MD  QUEtiapine (SEROQUEL) 200 MG tablet Take 400 mg by mouth at bedtime.    [provider]  sodium chloride (OCEAN) 0.65 % SOLN nasal spray Place 1 spray into both nostrils as needed for congestion. 04/22/18   Georgetta Haber, NP  tamsulosin (FLOMAX) 0.4 MG CAPS capsule Take 0.8 mg by mouth daily.  10/07/15   [provider]  vitamin B-12 (CYANOCOBALAMIN) 1000 MCG tablet Take 1,000 mcg by mouth daily.    [provider]    Family History Family History  Problem Relation Age of Onset  . Heart attack Mother   . Heart disease Father   . Diabetes Father     Social History Social History   Tobacco Use  . Smoking status: Former Games developer  . Smokeless tobacco: Current User    Types: Chew  Substance Use Topics  . Alcohol use: Yes    Comment: 3 drinks/month.   . Drug use: Not on file     Allergies   Patient has  no known allergies.   Review of Systems Review of Systems   Physical Exam Triage Vital Signs ED Triage Vitals  Enc Vitals Group     BP 12/12/18 0855 127/70     Pulse Rate 12/12/18 0855 79     Resp 12/12/18 0855 20     Temp 12/12/18 0855 97.8 F (36.6 C)     Temp Source 12/12/18 0855 Oral     SpO2 12/12/18 0855 97 %     Weight --      Height --      Head Circumference --      Peak Flow --      Pain Score 12/12/18 0851 8     Pain Loc --      Pain Edu? --      Excl. in GC? --    No data found.  Updated Vital Signs BP 127/70 (BP Location: Right Arm)   Pulse 79   Temp 97.8 F (36.6 C) (Oral)   Resp 20   SpO2 97%   Visual Acuity Right Eye Distance:   Left Eye Distance:   Bilateral Distance:    Right Eye Near:   Left Eye Near:    Bilateral Near:     Physical Exam Vitals signs and nursing note reviewed.  Constitutional:      General: He is not in acute distress.    Appearance: Normal appearance. He is not ill-appearing, toxic-appearing or diaphoretic.  HENT:     Head: Normocephalic and atraumatic.     Right Ear: Tympanic membrane and ear canal normal.     Left Ear: Tympanic membrane and ear canal normal.     Nose: Nose normal.     Mouth/Throat:     Pharynx: Oropharynx is clear.  Eyes:     General: No scleral icterus.       Right eye: No discharge.        Left eye: No discharge.     Extraocular Movements: Extraocular movements intact.     Conjunctiva/sclera: Conjunctivae normal.     Pupils: Pupils are equal, round, and reactive to light.  Neck:      Musculoskeletal: Normal range of motion.  Cardiovascular:     Rate and Rhythm: Normal rate and regular rhythm.     Pulses: Normal pulses.     Heart sounds: Normal heart sounds.  Pulmonary:     Effort: Pulmonary effort is normal.     Breath sounds: Normal breath sounds.  Musculoskeletal: Normal range of motion.  Skin:    General: Skin is warm and dry.  Neurological:     General: No focal deficit present.     Mental Status: He is alert.     Cranial Nerves: No cranial nerve deficit.     Sensory: No sensory deficit.     Motor: No weakness.     Coordination: Coordination normal.  Psychiatric:        Mood and Affect: Mood normal.      UC Treatments / Results  Labs (all labs ordered are listed, but only abnormal results are displayed) Labs Reviewed - No data to display  EKG   Radiology Dg Chest 2 View  Result Date: 12/10/2018 CLINICAL DATA:  Shortness of breath, COVID negative. EXAM: CHEST - 2 VIEW COMPARISON:  12/08/2018. FINDINGS: Apical lordotic view. Trachea is midline. Heart is enlarged. Lungs are clear. No pleural fluid. Degenerative changes in the spine. IMPRESSION: No acute findings. Electronically Signed   By: Leanna BattlesMelinda  Blietz M.D.  On: 12/10/2018 10:40   Mr Brain Wo Contrast (neuro Protocol)  Result Date: 12/10/2018 CLINICAL DATA:  Ataxia, stroke suspected. Additional history provided: Dizziness, nausea, vomiting and shortness of breath since Saturday. EXAM: MRI HEAD WITHOUT CONTRAST TECHNIQUE: Multiplanar, multiecho pulse sequences of the brain and surrounding structures were obtained without intravenous contrast. COMPARISON:  Head CT examinations 03/13/2018 and earlier FINDINGS: Brain: There is no convincing evidence of acute infarct. No evidence of intracranial mass. No midline shift or extra-axial fluid collection. No chronic intracranial blood products. No significant white matter disease for age. Cerebral volume is normal for age. Vascular: Flow voids maintained  within the proximal large arterial vessels. Skull and upper cervical spine: No focal marrow lesion Sinuses/Orbits: Visualized orbits demonstrate no acute abnormality. Mild ethmoid sinus mucosal thickening. Trace fluid within bilateral mastoid air cells. IMPRESSION: 1. No evidence of acute intracranial abnormality, including acute infarct. 2. Normal MRI appearance of the brain for age. 3. Trace bilateral mastoid effusions. Electronically Signed   By: Kellie Simmering DO   On: 12/10/2018 12:58    Procedures Procedures (including critical care time)  Medications Ordered in UC Medications - No data to display  Initial Impression / Assessment and Plan / UC Course  I have reviewed the triage vital signs and the nursing notes.  Pertinent labs & imaging results that were available during my care of the patient were reviewed by me and considered in my medical decision making (see chart for details).    Dizziness/Eustachian tube dysfunction  58 year old male that has had multiple visits for similar complaints in the ER. Full workup to include MRI that was negative for CVA No signs of ear infection today and neuro exam was normal Taking an old meclizine prescription.  MRI did show  mucosal thickening.  Could be related to allergies or sinus infection.  Eustachian tube dysfunction is likely the cause of his symptoms.  We will treat with Flonase. He can continue the meclizine 3 x day as needed.  Told to call Friday if no improvement.  Pt understanding and agreed.  Final Clinical Impressions(s) / UC Diagnoses   Final diagnoses:  Dizziness  Dysfunction of right eustachian tube     Discharge Instructions     Take the meclizine 3 times a day as needed. Flonase nasal spray 2 sprays in each nostril daily If your symptoms are not improving or worse by Friday please give [place] a call     ED Prescriptions    Medication Sig Dispense Auth. Provider   meclizine (ANTIVERT) 25 MG tablet Take 1 tablet (25  mg total) by mouth 3 (three) times daily as needed for dizziness. 30 tablet Yadriel Kerrigan A, NP   fluticasone (FLONASE) 50 MCG/ACT nasal spray Place 1 spray into both nostrils daily. 16 g Loura Halt A, NP     PDMP not reviewed this encounter.   Orvan July, NP 12/12/18 1009

## 2018-12-12 NOTE — ED Triage Notes (Signed)
patient reports dizziness since Saturday.  Patient reports he has been to ED two times since onset.  Patient prescribed meclizine, did not get med filled.  Has been taking meclizine he has at home, unsure of age of medicine.  Patient thinks he needs an antibiotic.    Patient has a headache, denies chest pain

## 2018-12-12 NOTE — Discharge Instructions (Signed)
Take the meclizine 3 times a day as needed. Flonase nasal spray 2 sprays in each nostril daily If your symptoms are not improving or worse by Friday please give [place] a call

## 2019-02-21 ENCOUNTER — Ambulatory Visit: Payer: Medicare Other | Admitting: Physical Therapy

## 2019-02-28 ENCOUNTER — Ambulatory Visit: Payer: Medicare Other | Attending: Otolaryngology | Admitting: Physical Therapy

## 2019-02-28 ENCOUNTER — Other Ambulatory Visit: Payer: Self-pay

## 2019-02-28 DIAGNOSIS — R296 Repeated falls: Secondary | ICD-10-CM | POA: Insufficient documentation

## 2019-02-28 DIAGNOSIS — R2681 Unsteadiness on feet: Secondary | ICD-10-CM | POA: Insufficient documentation

## 2019-02-28 DIAGNOSIS — R262 Difficulty in walking, not elsewhere classified: Secondary | ICD-10-CM | POA: Diagnosis present

## 2019-02-28 DIAGNOSIS — R42 Dizziness and giddiness: Secondary | ICD-10-CM | POA: Diagnosis not present

## 2019-02-28 NOTE — Therapy (Signed)
[facility] [address] [place], [place], [address] Phone: [phone]   Fax:  [phone]  Physical Therapy Evaluation  Patient Details  Name: Matthew Montes MRN: [id number] Date of Birth: 1960-10-20 Referring Provider (PT): Flo Shanks, MD   Encounter Date: 02/28/2019  PT End of Session - 02/28/19 1130    Visit Number  1    Number of Visits  17    Date for PT Re-Evaluation  04/29/19    Authorization Type  [employer] -[facility] and Medicaid; 10th visit PN    PT Start Time  0800    PT Stop Time  0850    PT Time Calculation (min)  50 min    Activity Tolerance  Patient tolerated treatment well    Behavior During Therapy  [facility] for tasks assessed/performed       Past Medical History:  Diagnosis Date  . ADHD (attention deficit hyperactivity disorder)   . Asthma   . Cardiomegaly   . Chronic back pain   . Chronic neck pain   . COPD (chronic obstructive pulmonary disease) (HCC)   . Diabetes mellitus without complication (HCC)   . Hypertension   . Liver failure (HCC)   . Renal disorder   . Renal insufficiency   . Schizophrenia [facility])     Past Surgical History:  Procedure Laterality Date  . BACK SURGERY    . NECK SURGERY    . SPINAL FUSION      There were no vitals filed for this visit.   Subjective Assessment - 02/28/19 0806    Subjective  Pt has presented to ED multiple times for dizziness.  Most recent episode was more severe with nausea, vomiting, falls - had to call ambulance.  Was referred to ENT who did not find true vertigo and was referred to vestibular rehab.  Pt is still taking Meclizine, dizziness has improved but still notices dizziness when doing a lot of movement - turning, walking dog, getting up and down.    Pertinent History  : GI bleed, major depressive disorder, type 2 DM, ADHD, anemia, asthma, cardiomegaly, chronic back pain, chronic neck pain, COPD, HTN, liver failure, renal disorder, schizophrenia and  multiple back/neck surgeries    Diagnostic tests  MRI    Patient Stated Goals  "help me not bring on the dizziness"    Currently in Pain?  Yes   history of headaches, neck pain        [facility] PT Assessment - 02/28/19 0813      Assessment   Medical Diagnosis  vertigo    Referring Provider (PT)  Flo Shanks, MD    Onset Date/Surgical Date  12/08/18    Prior Therapy  yes      Precautions   Precautions  Other (comment)    Precaution Comments  : GI bleed, major depressive disorder, type 2 DM, ADHD, anemia, asthma, cardiomegaly, chronic back pain, chronic neck pain, COPD, HTN, liver failure, renal disorder, schizophrenia and multiple back/neck surgeries      Balance Screen   Has the patient fallen in the past 6 months  Yes    How many times?  1    Has the patient had a decrease in activity level because of a fear of falling?   Yes    Is the patient reluctant to leave their home because of a fear of falling?   No      Home Public house manager residence  Living Arrangements  Alone    Type of [place]  One level    Additional Comments  Driving      Prior Function   Level of Independence  Independent    Vocation  Part time employment    Vocation Requirements  [employer] - in and out of vehicles, walking a lot      Observation/Other Assessments   Focus on Therapeutic Outcomes (FOTO)   N/A      Sensation   Light Touch  Impaired by gross assessment    Additional Comments  Decreased sensation due to neuropathy and neck surgeries      ROM / Strength   AROM / PROM / Strength  AROM      AROM   Overall AROM   Deficits    Overall AROM Comments  Cervical limited due to surgery: ~45 deg to L, >45 to R, limited extension and flexion and severely limited lateral flexion           Vestibular Assessment - 02/28/19 0820      Vestibular Assessment   General Observation  reports no changes in  hearing, denies tinnitus, denies changes in vision      Symptom Behavior   Subjective history of current problem  woke up feeling fine, was cleaning when dizziness started    Type of Dizziness   Imbalance;Spinning    Frequency of Dizziness  daily    Duration of Dizziness  initially it lasted for a few days; now symptom duration can vary from minutes to all day - gets worse with more activity and by the end of the day he feels worse    Symptom Nature  Motion provoked    Aggravating Factors  Activity in general;Turning body quickly;Turning head quickly;Driving    Relieving Factors  Medication;Slow movements    Progression of Symptoms  Better      Oculomotor Exam   Oculomotor Alignment  Normal    Ocular ROM  WFL    Spontaneous  Left beating nystagmus    Gaze-induced   Left beating nystagmus with R gaze;Left beating nystagmus with L gaze    Smooth Pursuits  Saccades    Saccades  Intact    Comment  Convergence intact      Oculomotor Exam-Fixation Suppressed    Left Head Impulse  negative    Right Head Impulse  positive      Vestibulo-Ocular Reflex   VOR to Slow Head Movement  Normal    VOR Cancellation  Normal      Visual Acuity   Static  7    Dynamic  4      Positional Testing   Dix-Hallpike  Dix-Hallpike Right;Dix-Hallpike Left    Horizontal Canal Testing  Horizontal Canal Right;Horizontal Canal Left      Dix-Hallpike Right   Dix-Hallpike Right Duration  0    Dix-Hallpike Right Symptoms  No nystagmus      Dix-Hallpike Left   Dix-Hallpike Left Duration  0    Dix-Hallpike Left Symptoms  No nystagmus      Horizontal Canal Right   Horizontal Canal Right Duration  0    Horizontal Canal Right Symptoms  Normal      Horizontal Canal Left   Horizontal Canal Left Duration  0    Horizontal Canal Left Symptoms  Normal      Positional Sensitivities   Sit to Supine  No dizziness    Supine to Left Side  No dizziness    Supine to Right Side  No dizziness    Supine to Sitting   Lightheadedness    Right Hallpike  Mild dizziness    Up from Right Hallpike  Mild dizziness    Up from Left Hallpike  No dizziness    Nose to Right Knee  No dizziness    Right Knee to Sitting  No dizziness    Nose to Left Knee  No dizziness    Left Knee to Sitting  No dizziness    Head Turning x 5  No dizziness    Head Nodding x 5  Mild dizziness    Pivot Right in Standing  No dizziness    Pivot Left in Standing  Mild dizziness    Rolling Right  No dizziness    Rolling Left  No dizziness          Objective measurements completed on examination: See above findings.              PT Education - 02/28/19 1129    Education Details  clinical findings, PT POC and goals    Person(s) Educated  Patient    Methods  Explanation    Comprehension  Verbalized understanding       PT Short Term Goals - 02/28/19 1140      PT SHORT TERM GOAL #1   Title  Pt will participate in assessment of FGA for falls risk assessment when ambulating.    Baseline  Not assessed to date    Time  4    Period  Weeks    Status  New    Target Date  03/30/19      PT SHORT TERM GOAL #2   Title  Pt will initiate HEP for balance and vestibular hypofunction    Baseline  total A    Time  4    Period  Weeks    Status  New    Target Date  03/30/19        PT Long Term Goals - 02/28/19 1143      PT LONG TERM GOAL #1   Title  Pt will demonstrate independence with final vestibular and balance HEP    Baseline  total A    Time  8    Period  Weeks    Status  New    Target Date  04/29/19      PT LONG TERM GOAL #2   Title  Pt will demonstrate decreased falls risk during community gait as indicated by 4 point increase in FGA    Baseline  not assessed to date    Time  8    Period  Weeks    Status  New    Target Date  04/29/19      PT LONG TERM GOAL #3   Title  Pt will demonstrate improved use of VOR as indicated by 2 line difference in DVA    Baseline  3 line difference (7, 4)    Time  8     Period  Weeks    Status  New    Target Date  04/29/19      PT LONG TERM GOAL #4   Title  Pt will report 0/5 dizziness with quick head turns/nods, quick body turns, reaching down to floor and with fast walking    Baseline  mild dizziness    Time  8  Period  Weeks    Status  New    Target Date  04/29/19             Plan - 02/28/19 1136    Clinical Impression Statement  Pt is a 59 year old male referred to Neuro OPPT for evaluation of vertigo who presented to ED on 12/08/2018, 12/10/2018 and 12/12/2018 for dizziness/lightheadedness, vomiting and falls - treated with Antivert and no acute findings on MRI.  Pt's PMH is significant for the following: GI bleed, major depressive disorder, type 2 DM, ADHD, anemia, asthma, cardiomegaly, chronic back pain, chronic neck pain, COPD, HTN, liver failure, renal disorder, schizophrenia and multiple back/neck surgeries. The following deficits were noted during pt's exam: impaired cervical spine ROM, disequilibrium, motion sensitivity, impaired use of VOR with 3 line difference in DVA indicating vestibular hypofunction and impaired balance.  Pt was negative for nystagmus and vertigo with positional testing.  Pt would benefit from skilled PT to address these impairments and functional limitations to maximize functional mobility independence and reduce falls risk.    Personal Factors and Comorbidities  Comorbidity 3+;Profession;Social Background    Comorbidities  : GI bleed, major depressive disorder, type 2 DM, ADHD, anemia, asthma, cardiomegaly, chronic back pain, chronic neck pain, COPD, HTN, liver failure, renal disorder, schizophrenia and multiple back/neck surgeries    Examination-Activity Limitations  Bend;Locomotion Level;Squat;Stand;Transfers    Examination-Participation Restrictions  Cleaning;Community Activity;Driving    Stability/Clinical Decision Making  Evolving/Moderate complexity    Clinical Decision Making  Moderate    Rehab Potential  Good     PT Frequency  2x / week    PT Duration  8 weeks    PT Treatment/Interventions  ADLs/Self Care Home Management;Canalith Repostioning;Gait training;Functional mobility training;Therapeutic activities;Therapeutic exercise;Balance training;Neuromuscular re-education;Patient/family education;Passive range of motion;Vestibular;Cryotherapy;Moist Heat    PT Next Visit Plan  assess FGA; instruct in x1 viewing and standing balance HEP    Consulted and Agree with Plan of Care  Patient       Patient will benefit from skilled therapeutic intervention in order to improve the following deficits and impairments:  Decreased balance, Decreased range of motion, Difficulty walking, Dizziness  Visit Diagnosis: Dizziness and giddiness  Unsteadiness on feet  Repeated falls  Difficulty in walking, not elsewhere classified     Problem List Patient Active Problem List   Diagnosis Date Noted  . Cellulitis of submandibular region 10/10/2015    Dierdre Highman, PT, DPT 02/28/19    11:47 AM    [facility] [address] [place], [place], [address] Phone: [phone]   Fax:  [phone]  Name: Matthew Montes MRN: [id number] Date of Birth: Sep 22, 1960

## 2019-03-08 ENCOUNTER — Encounter: Payer: Self-pay | Admitting: Physical Therapy

## 2019-03-08 ENCOUNTER — Ambulatory Visit: Payer: Medicare Other | Admitting: Physical Therapy

## 2019-03-08 ENCOUNTER — Other Ambulatory Visit: Payer: Self-pay

## 2019-03-08 DIAGNOSIS — R262 Difficulty in walking, not elsewhere classified: Secondary | ICD-10-CM

## 2019-03-08 DIAGNOSIS — R296 Repeated falls: Secondary | ICD-10-CM

## 2019-03-08 DIAGNOSIS — R42 Dizziness and giddiness: Secondary | ICD-10-CM

## 2019-03-08 DIAGNOSIS — R2681 Unsteadiness on feet: Secondary | ICD-10-CM

## 2019-03-08 NOTE — Therapy (Signed)
[facility] [address] [place], [place], [address] Phone: [phone]   Fax:  [phone]  Physical Therapy Treatment  Patient Details  Name: Matthew Montes MRN: [id number] Date of Birth: 10/12/1960 Referring Provider (PT): Jodi Marble, MD   Encounter Date: 03/08/2019  PT End of Session - 03/08/19 1321    Visit Number  2    Number of Visits  17    Date for PT Re-Evaluation  04/29/19    Authorization Type  [employer] -[facility] and Medicaid; 10th visit PN    PT Start Time  1115    PT Stop Time  1200    PT Time Calculation (min)  45 min    Activity Tolerance  Patient tolerated treatment well    Behavior During Therapy  [facility] for tasks assessed/performed       Past Medical History:  Diagnosis Date  . ADHD (attention deficit hyperactivity disorder)   . Asthma   . Cardiomegaly   . Chronic back pain   . Chronic neck pain   . COPD (chronic obstructive pulmonary disease) ([employer])   . Diabetes mellitus without complication ([place])   . Hypertension   . Liver failure ([place])   . Renal disorder   . Renal insufficiency   . Schizophrenia [facility])     Past Surgical History:  Procedure Laterality Date  . BACK SURGERY    . NECK SURGERY    . SPINAL FUSION      There were no vitals filed for this visit.  Subjective Assessment - 03/08/19 1121    Subjective  After the initial assessment pt feels like it set him back and had to take Meclizine.  Then over the weekend he did a lot around the weekend.  Tuesday at work he began to get very dizzy but was out of Meclizine. Dizziness got worse with nausea and had to leave work early.  Was able to get Meclizine filled to be able to go to doctor's appointment and work on Thursday.  Not feeling well today and plans to spend the rest of the day resting.    Pertinent History  : GI bleed, major depressive disorder, type 2 DM, ADHD, anemia, asthma, cardiomegaly, chronic back pain, chronic neck pain, COPD,  HTN, liver failure, renal disorder, schizophrenia and multiple back/neck surgeries    Diagnostic tests  MRI    Patient Stated Goals  "help me not bring on the dizziness"    Currently in Pain?  No/denies         [facility] PT Assessment - 03/08/19 1129      Functional Gait  Assessment   Gait assessed   Yes    Gait Level Surface  Walks 20 ft in less than 7 sec but greater than 5.5 sec, uses assistive device, slower speed, mild gait deviations, or deviates 6-10 in outside of the 12 in walkway width.    Change in Gait Speed  Able to change speed, demonstrates mild gait deviations, deviates 6-10 in outside of the 12 in walkway width, or no gait deviations, unable to achieve a major change in velocity, or uses a change in velocity, or uses an assistive device.    Gait with Horizontal Head Turns  Performs head turns smoothly with no change in gait. Deviates no more than 6 in outside 12 in walkway width    Gait with Vertical Head Turns  Performs task with slight change in gait velocity (eg, minor disruption to smooth gait path), deviates  6 - 10 in outside 12 in walkway width or uses assistive device    Gait and Pivot Turn  Pivot turns safely in greater than 3 sec and stops with no loss of balance, or pivot turns safely within 3 sec and stops with mild imbalance, requires small steps to catch balance.    Step Over Obstacle  Is able to step over one shoe box (4.5 in total height) but must slow down and adjust steps to clear box safely. May require verbal cueing.    Gait with Narrow Base of Support  Ambulates less than 4 steps heel to toe or cannot perform without assistance.    Gait with Eyes Closed  Cannot walk 20 ft without assistance, severe gait deviations or imbalance, deviates greater than 15 in outside 12 in walkway width or will not attempt task.    Ambulating Backwards  Walks 20 ft, slow speed, abnormal gait pattern, evidence for imbalance, deviates 10-15 in outside 12 in walkway width.    Steps   Alternating feet, must use rail.    Total Score  15    FGA comment:  15/30 symptoms increased by 30%; pt feels like he has slowed down about 50% since symptoms started                    Vestibular Treatment/Exercise - 03/08/19 1317      Vestibular Treatment/Exercise   Vestibular Treatment Provided  Gaze    Gaze Exercises  X1 Viewing Horizontal;X1 Viewing Vertical      X1 Viewing Horizontal   Foot Position  seated with back support    Reps  1    Comments  30 seconds      X1 Viewing Vertical   Foot Position  seated with back support    Reps  1    Comments  30 seconds         Balance Exercises - 03/08/19 1317      Balance Exercises: Standing   Standing Eyes Opened  Narrow base of support (BOS);Head turns;Solid surface;Other reps (comment)   10 reps head turns/nods       PT Education - 03/08/19 1319    Education Details  FGA results, initiated HEP    Person(s) Educated  Patient    Methods  Explanation;Demonstration;Handout    Comprehension  Verbalized understanding;Returned demonstration       PT Short Term Goals - 03/08/19 1330      PT SHORT TERM GOAL #1   Title  Pt will participate in assessment of FGA for falls risk assessment when ambulating.    Baseline  Not assessed to date    Time  4    Period  Weeks    Status  Achieved    Target Date  03/30/19      PT SHORT TERM GOAL #2   Title  Pt will initiate HEP for balance and vestibular hypofunction    Baseline  total A    Time  4    Period  Weeks    Status  Achieved    Target Date  03/30/19        PT Long Term Goals - 03/08/19 1330      PT LONG TERM GOAL #1   Title  Pt will demonstrate independence with final vestibular and balance HEP  (04/29/2019)    Baseline  total A    Time  8    Period  Weeks    Status  New  PT LONG TERM GOAL #2   Title  Pt will demonstrate decreased falls risk during community gait as indicated by 4 point increase in FGA    Baseline  15/30    Time  8     Period  Weeks    Status  New      PT LONG TERM GOAL #3   Title  Pt will demonstrate improved use of VOR as indicated by 2 line difference in DVA    Baseline  3 line difference (7, 4)    Time  8    Period  Weeks    Status  New      PT LONG TERM GOAL #4   Title  Pt will report 0/5 dizziness with quick head turns/nods, quick body turns, reaching down to floor and with fast walking    Baseline  mild dizziness    Time  8    Period  Weeks    Status  New            Plan - 03/08/19 1321    Clinical Impression Statement  Continued assessment of falls risk and balance during dynamic gait with FGA with pt demonstrating high falls risk especially when not able to utilize vision, when performing head motions and when ambulating with more narrow BOS.  Initiated vestibular and balance HEP focusing on habituation to head turns/nods and balance with narrow BOS.  Pt limited by fatigue and dizziness today.  Will continue to assess response to treatment and gradually progress towards LTG.    Personal Factors and Comorbidities  Comorbidity 3+;Profession;Social Background    Comorbidities  : GI bleed, major depressive disorder, type 2 DM, ADHD, anemia, asthma, cardiomegaly, chronic back pain, chronic neck pain, COPD, HTN, liver failure, renal disorder, schizophrenia and multiple back/neck surgeries    Examination-Activity Limitations  Bend;Locomotion Level;Squat;Stand;Transfers    Examination-Participation Restrictions  Cleaning;Community Activity;Driving    Stability/Clinical Decision Making  Evolving/Moderate complexity    Rehab Potential  Good    PT Frequency  2x / week    PT Duration  8 weeks    PT Treatment/Interventions  ADLs/Self Care Home Management;Canalith Repostioning;Gait training;Functional mobility training;Therapeutic activities;Therapeutic exercise;Balance training;Neuromuscular re-education;Patient/family education;Passive range of motion;Vestibular;Cryotherapy;Moist Heat    PT Next  Visit Plan  How did he tolerate HEP?  adjust if necessary.  Continue to add to balance and vestibular HEP -pt's symptoms flare easily.    Consulted and Agree with Plan of Care  Patient       Patient will benefit from skilled therapeutic intervention in order to improve the following deficits and impairments:  Decreased balance, Decreased range of motion, Difficulty walking, Dizziness  Visit Diagnosis: Dizziness and giddiness  Unsteadiness on feet  Repeated falls  Difficulty in walking, not elsewhere classified     Problem List Patient Active Problem List   Diagnosis Date Noted  . Cellulitis of submandibular region 10/10/2015    Dierdre Highman, PT, DPT 03/08/19    1:32 PM    [facility] [address] [place], [place], [address] Phone: [phone]   Fax:  [phone]  Name: Matthew Montes MRN: [id number] Date of Birth: 1960-03-29

## 2019-03-08 NOTE — Patient Instructions (Signed)
Gaze Stabilization: Sitting    Keeping eyes on target on wall 3 feet away, and move head side to side for _30__ seconds. Repeat while moving head up and down for __30__ seconds. Do __2-3__ sessions per day.  Gaze Stabilization: Tip Card  1.Target must remain in focus, not blurry, and appear stationary while head is in motion. 2.Perform exercises with small head movements (45 to either side of midline). 3.Increase speed of head motion so long as target is in focus. 4.If you wear eyeglasses, be sure you can see target through lens (therapist will give specific instructions for bifocal / progressive lenses). 5.These exercises may provoke dizziness or nausea. Work through these symptoms. If too dizzy, slow head movement slightly. Rest between each exercise. 6.Exercises demand concentration; avoid distractions.   Feet Together, Head Motion - Eyes Open    With eyes open, feet together, move head slowly: up and down 10 times keeping your balance. Repeat 2 times per session. Do 1 sessions per day.  Copyright  VHI. All rights reserved.                         Feet Partial Heel-Toe, Head Motion - Eyes Open

## 2019-03-11 ENCOUNTER — Other Ambulatory Visit: Payer: Self-pay

## 2019-03-11 ENCOUNTER — Ambulatory Visit: Payer: Medicare Other | Admitting: Physical Therapy

## 2019-03-11 ENCOUNTER — Encounter: Payer: Self-pay | Admitting: Physical Therapy

## 2019-03-11 VITALS — BP 117/67 | HR 82

## 2019-03-11 DIAGNOSIS — R42 Dizziness and giddiness: Secondary | ICD-10-CM

## 2019-03-11 DIAGNOSIS — R2681 Unsteadiness on feet: Secondary | ICD-10-CM

## 2019-03-11 NOTE — Therapy (Signed)
[facility] [address] [place], [place], [address] Phone: [phone]   Fax:  [phone]  Physical Therapy Treatment  Patient Details  Name: Matthew Montes MRN: [id number] Date of Birth: 04-16-1960 Referring Provider (PT): Jodi Marble, MD   Encounter Date: 03/11/2019  PT End of Session - 03/11/19 2031    Visit Number  3    Number of Visits  17    Date for PT Re-Evaluation  04/29/19    Authorization Type  [employer] -[employer] and Medicaid; 10th visit PN    PT Start Time  0802    PT Stop Time  0846    PT Time Calculation (min)  44 min    Activity Tolerance  Patient tolerated treatment well    Behavior During Therapy  [facility] for tasks assessed/performed       Past Medical History:  Diagnosis Date  . ADHD (attention deficit hyperactivity disorder)   . Asthma   . Cardiomegaly   . Chronic back pain   . Chronic neck pain   . COPD (chronic obstructive pulmonary disease) ([place])   . Diabetes mellitus without complication ([place])   . Hypertension   . Liver failure ([place])   . Renal disorder   . Renal insufficiency   . Schizophrenia [facility])     Past Surgical History:  Procedure Laterality Date  . BACK SURGERY    . NECK SURGERY    . SPINAL FUSION      Vitals:   03/11/19 0837  BP: 117/67  Pulse: 82    Subjective Assessment - 03/11/19 0801    Subjective  Pt states yesterday was "not a good day" - pt states that the weekends are not going to be good for the next 6 weeks because he is working at the [employer] and coming to therapy on Mon. & Friday - only has 2 days off and is doing stuff in his apartment on those 2 days; states he did the exercises this weekend which provoked the dizziness; pt reports he is dizzy now and is nauseous - rates dizziness 3/10 intensity    Pertinent History  : GI bleed, major depressive disorder, type 2 DM, ADHD, anemia, asthma, cardiomegaly, chronic back pain, chronic neck pain, COPD, HTN,  liver failure, renal disorder, schizophrenia and multiple back/neck surgeries    Diagnostic tests  MRI    Patient Stated Goals  "help me not bring on the dizziness"    Currently in Pain?  Yes   "chronic pain every day"   Pain Location  Other (Comment)   Generalized   Pain Orientation  Right;Left;Lower    Pain Descriptors / Indicators  Aching    Pain Type  Chronic pain    Pain Onset  More than a month ago    Pain Frequency  Constant                        Vestibular Treatment/Exercise - 03/11/19 0001      Vestibular Treatment/Exercise   Vestibular Treatment Provided  Gaze    Gaze Exercises  X1 Viewing Horizontal;X1 Viewing Vertical      X1 Viewing Horizontal   Foot Position  seated position on 1st rep; standing on 2nd rep    Time  --   60 secs seated; 30 secs standig   Reps  2    Comments  pt reported no significant change in dizziness in seated or standing positions  X1 Viewing Vertical   Foot Position  seated on 1st rep; standing on 2nd rep    Time  --   60 secs seated:  30 secs in standing   Reps  2    Comments  pt reported no significant change in vertigo in either position         Balance Exercises - 03/11/19 2025      Balance Exercises: Standing   Standing Eyes Opened  Wide (BOA);Head turns;Foam/compliant surface;5 reps    Standing Eyes Closed  Wide (BOA);Foam/compliant surface;5 reps   min assist for recovery of LOB   Other Standing Exercises  Pt performed trunk rotations standing in corner on floor - straight across 5 reps, then diagonal patterns "X" patterns 5 reps each  - no major LOB with turning and minimal increase in dizziness reported         PT Education - 03/11/19 2029    Education Details  progressed x1 viewing exercise to standing position but decreased time from 60 secs to 30 secs - pt used back of chair in front of him for UE support prn    Person(s) Educated  Patient    Methods  Explanation;Handout;Demonstration     Comprehension  Verbalized understanding;Returned demonstration       PT Short Term Goals - 03/11/19 2040      PT SHORT TERM GOAL #1   Title  Pt will participate in assessment of FGA for falls risk assessment when ambulating.    Baseline  Not assessed to date    Time  4    Period  Weeks    Status  Achieved    Target Date  03/30/19      PT SHORT TERM GOAL #2   Title  Pt will initiate HEP for balance and vestibular hypofunction    Baseline  total A    Time  4    Period  Weeks    Status  Achieved    Target Date  03/30/19        PT Long Term Goals - 03/11/19 2040      PT LONG TERM GOAL #1   Title  Pt will demonstrate independence with final vestibular and balance HEP  (04/29/2019)    Baseline  total A    Time  8    Period  Weeks    Status  New      PT LONG TERM GOAL #2   Title  Pt will demonstrate decreased falls risk during community gait as indicated by 4 point increase in FGA    Baseline  15/30    Time  8    Period  Weeks    Status  New      PT LONG TERM GOAL #3   Title  Pt will demonstrate improved use of VOR as indicated by 2 line difference in DVA    Baseline  3 line difference (7, 4)    Time  8    Period  Weeks    Status  New      PT LONG TERM GOAL #4   Title  Pt will report 0/5 dizziness with quick head turns/nods, quick body turns, reaching down to floor and with fast walking    Baseline  mild dizziness    Time  8    Period  Weeks    Status  New            Plan - 03/11/19 6720    Clinical Impression  Statement  Pt had mild to moderate postural instability with standing on compliant surface with EC, indicative of vestibular hypofunction.  Pt reported minimal change in dizziness with x1 viewing exercise with horizontal and vertical head turns.  Pt reported some back and neck discomfort with the activities incorporating trunk rotation and reaching.    Personal Factors and Comorbidities  Comorbidity 3+;Profession;Social Background    Comorbidities  : GI  bleed, major depressive disorder, type 2 DM, ADHD, anemia, asthma, cardiomegaly, chronic back pain, chronic neck pain, COPD, HTN, liver failure, renal disorder, schizophrenia and multiple back/neck surgeries    Examination-Activity Limitations  Bend;Locomotion Level;Squat;Stand;Transfers    Examination-Participation Restrictions  Cleaning;Community Activity;Driving    Stability/Clinical Decision Making  Evolving/Moderate complexity    Rehab Potential  Good    PT Frequency  2x / week    PT Duration  8 weeks    PT Treatment/Interventions  ADLs/Self Care Home Management;Canalith Repostioning;Gait training;Functional mobility training;Therapeutic activities;Therapeutic exercise;Balance training;Neuromuscular re-education;Patient/family education;Passive range of motion;Vestibular;Cryotherapy;Moist Heat    PT Next Visit Plan  Check upgraded HEP as x1 viewing progressed from seated to standing -   Continue to add to balance and vestibular HEP - vestibular/balance exercises    Consulted and Agree with Plan of Care  Patient       Patient will benefit from skilled therapeutic intervention in order to improve the following deficits and impairments:  Decreased balance, Decreased range of motion, Difficulty walking, Dizziness  Visit Diagnosis: Dizziness and giddiness  Unsteadiness on feet     Problem List Patient Active Problem List   Diagnosis Date Noted  . Cellulitis of submandibular region 10/10/2015    Kary Kos, PT 03/11/2019, 8:42 PM  [facility] [address] [place], [place], [address] Phone: [phone]   Fax:  [phone]  Name: Matthew Montes MRN: [id number] Date of Birth: 17-Jun-1960

## 2019-03-11 NOTE — Patient Instructions (Signed)
Gaze Stabilization: Standing Feet Apart    Feet shoulder width apart, keeping eyes on target on wall _5-6__ feet away, tilt head down 15-30 and move head side to side for 30-60__ seconds. Repeat while moving head up and down for _30-60___ seconds. Do __2-3_ sessions per day.   Copyright  VHI. All rights reserved.

## 2019-03-15 ENCOUNTER — Ambulatory Visit: Payer: Medicare Other | Admitting: Physical Therapy

## 2019-03-15 ENCOUNTER — Encounter: Payer: Self-pay | Admitting: Physical Therapy

## 2019-03-15 ENCOUNTER — Other Ambulatory Visit: Payer: Self-pay

## 2019-03-15 DIAGNOSIS — R42 Dizziness and giddiness: Secondary | ICD-10-CM | POA: Diagnosis not present

## 2019-03-15 DIAGNOSIS — R2681 Unsteadiness on feet: Secondary | ICD-10-CM

## 2019-03-15 DIAGNOSIS — R296 Repeated falls: Secondary | ICD-10-CM

## 2019-03-15 DIAGNOSIS — R262 Difficulty in walking, not elsewhere classified: Secondary | ICD-10-CM

## 2019-03-15 NOTE — Patient Instructions (Addendum)
Gaze Stabilization: Standing Feet Apart    Feet shoulder width apart, keeping eyes on target on wall _3-4__ feet away, tilt head down slightly and move head side to side for 60__ seconds. Repeat while moving head up and down for _60___ seconds. Do __2-3_ sessions per day.    Feet Together, Head Motion - Eyes Open    With eyes open, feet together, move head slowly: up and down 10 times keeping your balance. Repeat 2 times per session. Do 1 sessions per day.

## 2019-03-15 NOTE — Therapy (Signed)
[facility] [address] [place], [place], [address] Phone: [phone]   Fax:  [phone]  Physical Therapy Treatment  Patient Details  Name: Matthew Montes MRN: [id number] Date of Birth: 07/06/1960 Referring Provider (PT): Flo Shanks, MD   Encounter Date: 03/15/2019  PT End of Session - 03/15/19 1253    Visit Number  4    Number of Visits  17    Date for PT Re-Evaluation  04/29/19    Authorization Type  UHC -[employer] and Medicaid; 10th visit PN    PT Start Time  1151    PT Stop Time  1235    PT Time Calculation (min)  44 min    Activity Tolerance  Patient tolerated treatment well    Behavior During Therapy  [facility] for tasks assessed/performed       Past Medical History:  Diagnosis Date  . ADHD (attention deficit hyperactivity disorder)   . Asthma   . Cardiomegaly   . Chronic back pain   . Chronic neck pain   . COPD (chronic obstructive pulmonary disease) ([facility])   . Diabetes mellitus without complication ([facility])   . Hypertension   . Liver failure ([facility])   . Renal disorder   . Renal insufficiency   . Schizophrenia [facility])     Past Surgical History:  Procedure Laterality Date  . BACK SURGERY    . NECK SURGERY    . SPINAL FUSION      There were no vitals filed for this visit.  Subjective Assessment - 03/15/19 1203    Subjective  Tired today; had to call out of work on Tuesday due to fatigue and symptoms.  Still spending off days doing a lot around his home.  Agrees that it would be better to go down to 1x/week and prefers Fridays.    Pertinent History  : GI bleed, major depressive disorder, type 2 DM, ADHD, anemia, asthma, cardiomegaly, chronic back pain, chronic neck pain, COPD, HTN, liver failure, renal disorder, schizophrenia and multiple back/neck surgeries    Diagnostic tests  MRI    Patient Stated Goals  "help me not bring on the dizziness"    Currently in Pain?  No/denies    Pain Onset  More than a month  ago                       [facility] Adult PT Treatment/Exercise - 03/15/19 1238      Self-Care   Self-Care  Other Self-Care Comments    Other Self-Care Comments   In L sidelying after performing rolling performed box breathing with 3 second inhale, 3 second exhale to slow respiratory rate and increase diaphragmatic breathing.  Also performed grounding exercise in sidelying to assist with decreasing dizziness and headache.  Pt reporting easing of symptoms at end of session.      Vestibular Treatment/Exercise - 03/15/19 1211      Vestibular Treatment/Exercise   Vestibular Treatment Provided  Gaze;Habituation    Habituation Exercises  Horizontal Roll    Gaze Exercises  X1 Viewing Horizontal;X1 Viewing Vertical      Horizontal Roll   Number of Reps   3    Symptom Description   supine on mat; pt reporting mild-moderate symptoms and increase in HA.      X1 Viewing Horizontal   Foot Position  standing feet apart    Reps  2    Comments  progressed from 30 seconds to 60 seconds  with mild symptoms      X1 Viewing Vertical   Foot Position  standing feet apart    Reps  2    Comments  progressed from 30 seconds to 60 seconds with mild symptoms         Balance Exercises - 03/15/19 1216      Balance Exercises: Standing   Turning  Right;Left;3 reps   standing at counter, moderate symptoms, HA       PT Education - 03/15/19 1251    Education Details  x1 viewing increased to 60 seconds; did not add turning to HEP right now due to severity of symptoms    Person(s) Educated  Patient    Methods  Explanation;Demonstration;Handout    Comprehension  Verbalized understanding;Returned demonstration       PT Short Term Goals - 03/11/19 2040      PT SHORT TERM GOAL #1   Title  Pt will participate in assessment of FGA for falls risk assessment when ambulating.    Baseline  Not assessed to date    Time  4    Period  Weeks    Status  Achieved    Target Date  03/30/19      PT  SHORT TERM GOAL #2   Title  Pt will initiate HEP for balance and vestibular hypofunction    Baseline  total A    Time  4    Period  Weeks    Status  Achieved    Target Date  03/30/19        PT Long Term Goals - 03/11/19 2040      PT LONG TERM GOAL #1   Title  Pt will demonstrate independence with final vestibular and balance HEP  (04/29/2019)    Baseline  total A    Time  8    Period  Weeks    Status  New      PT LONG TERM GOAL #2   Title  Pt will demonstrate decreased falls risk during community gait as indicated by 4 point increase in FGA    Baseline  15/30    Time  8    Period  Weeks    Status  New      PT LONG TERM GOAL #3   Title  Pt will demonstrate improved use of VOR as indicated by 2 line difference in DVA    Baseline  3 line difference (7, 4)    Time  8    Period  Weeks    Status  New      PT LONG TERM GOAL #4   Title  Pt will report 0/5 dizziness with quick head turns/nods, quick body turns, reaching down to floor and with fast walking    Baseline  mild dizziness    Time  8    Period  Weeks    Status  New            Plan - 03/15/19 1254    Clinical Impression Statement  Treatment session focused on adjustment of visits and plan of care due to significant increase in symptoms each week to the point where pt is unable to work.  Pt does feel his recovery time is much faster though.  Able to progress x1 viewing in standing to 1 minute.  Attempted to add in turning in standing and then in supine (rolling) for habituation but pt reporting significant increase in symptoms plus headache so turning not added right now.  Will continue to progress as pt is able to tolerate.    Personal Factors and Comorbidities  Comorbidity 3+;Profession;Social Background    Comorbidities  : GI bleed, major depressive disorder, type 2 DM, ADHD, anemia, asthma, cardiomegaly, chronic back pain, chronic neck pain, COPD, HTN, liver failure, renal disorder, schizophrenia and multiple  back/neck surgeries    Examination-Activity Limitations  Bend;Locomotion Level;Squat;Stand;Transfers    Examination-Participation Restrictions  Cleaning;Community Activity;Driving    Stability/Clinical Decision Making  Evolving/Moderate complexity    Rehab Potential  Good    PT Frequency  2x / week    PT Duration  8 weeks    PT Treatment/Interventions  ADLs/Self Care Home Management;Canalith Repostioning;Gait training;Functional mobility training;Therapeutic activities;Therapeutic exercise;Balance training;Neuromuscular re-education;Patient/family education;Passive range of motion;Vestibular;Cryotherapy;Moist Heat    PT Next Visit Plan  Did pt have a better week with therapy only 1x?  Turning as habituation - may need to use spotting at first.  Progress corner balance and x1 viewing.    Consulted and Agree with Plan of Care  Patient       Patient will benefit from skilled therapeutic intervention in order to improve the following deficits and impairments:  Decreased balance, Decreased range of motion, Difficulty walking, Dizziness  Visit Diagnosis: Dizziness and giddiness  Unsteadiness on feet  Repeated falls  Difficulty in walking, not elsewhere classified     Problem List Patient Active Problem List   Diagnosis Date Noted  . Cellulitis of submandibular region 10/10/2015    Dierdre Highman, PT, DPT 03/15/19    12:59 PM    [place] [address] [place], [place], [address] Phone: [phone]   Fax:  [phone]  Name: QUANTRELL SPLITT MRN: [id number] Date of Birth: July 29, 1960

## 2019-03-18 ENCOUNTER — Ambulatory Visit: Payer: Medicare Other | Admitting: Physical Therapy

## 2019-03-22 ENCOUNTER — Other Ambulatory Visit: Payer: Self-pay

## 2019-03-22 ENCOUNTER — Ambulatory Visit: Payer: Medicare Other | Attending: Otolaryngology | Admitting: Physical Therapy

## 2019-03-22 ENCOUNTER — Encounter: Payer: Self-pay | Admitting: Physical Therapy

## 2019-03-22 DIAGNOSIS — R2681 Unsteadiness on feet: Secondary | ICD-10-CM

## 2019-03-22 DIAGNOSIS — R42 Dizziness and giddiness: Secondary | ICD-10-CM | POA: Diagnosis not present

## 2019-03-22 DIAGNOSIS — R262 Difficulty in walking, not elsewhere classified: Secondary | ICD-10-CM

## 2019-03-22 DIAGNOSIS — R296 Repeated falls: Secondary | ICD-10-CM

## 2019-03-22 NOTE — Patient Instructions (Addendum)
Gaze Stabilization: Standing Feet Apart    Feet shoulder width apart, keeping eyes on target on wall _3-4__ feet away, tilt head down slightly and move head side to side for 60__ seconds. Repeat while moving head up and down for _60___ seconds. Do __2-3_ sessions per day.    Feet Together, Head Motion - Eyes Open    With eyes open, feet together, move head slowly: up and down 10 times keeping your balance. Repeat 2 times per session. Do 1 sessions per day.        Compensatory Strategies: Corrective Saccades    1. Holding two stationary targets placed __12__ inches apart, move eyes to target on left, keep head still. 2. Then move head in direction of target to left, while eyes remain on target. 3/4. Repeat in opposite direction - eyes to the right and then head to the right. Perform sitting. Repeat sequence ___10_ times per session. Do __2__ sessions per day.

## 2019-03-23 NOTE — Therapy (Signed)
[facility] [address] [place], [place], [address] Phone: [phone]   Fax:  [phone]  Physical Therapy Treatment  Patient Details  Name: Matthew Montes MRN: [id number] Date of Birth: 12-25-1960 Referring Provider (PT): Flo Shanks, MD   Encounter Date: 03/22/2019  PT End of Session - 03/23/19 1010    Visit Number  5    Number of Visits  17    Date for PT Re-Evaluation  04/29/19    Authorization Type  [employer]HC -[place] and Medicaid; 10th visit PN    PT Start Time  0800    PT Stop Time  0846    PT Time Calculation (min)  46 min    Activity Tolerance  Patient tolerated treatment well    Behavior During Therapy  [facility] for tasks assessed/performed       Past Medical History:  Diagnosis Date  . ADHD (attention deficit hyperactivity disorder)   . Asthma   . Cardiomegaly   . Chronic back pain   . Chronic neck pain   . COPD (chronic obstructive pulmonary disease) ([facility])   . Diabetes mellitus without complication ([facility])   . Hypertension   . Liver failure ([facility])   . Renal disorder   . Renal insufficiency   . Schizophrenia [facility])     Past Surgical History:  Procedure Laterality Date  . BACK SURGERY    . NECK SURGERY    . SPINAL FUSION      There were no vitals filed for this visit.  Subjective Assessment - 03/22/19 0800    Subjective  This has been a better week since taking therapy down to 1x/week.  Feeling a little off balance today, had to take Meclizine.  Doing okay with exercise at home - symptoms are staying mild - still having some nausea.    Pertinent History  : GI bleed, major depressive disorder, type 2 DM, ADHD, anemia, asthma, cardiomegaly, chronic back pain, chronic neck pain, COPD, HTN, liver failure, renal disorder, schizophrenia and multiple back/neck surgeries    Diagnostic tests  MRI    Patient Stated Goals  "help me not bring on the dizziness"    Currently in Pain?  No/denies    Pain Onset  More  than a month ago                        Vestibular Treatment/Exercise - 03/22/19 0803      Vestibular Treatment/Exercise   Vestibular Treatment Provided  Gaze    Habituation Exercises  Standing Horizontal Head Turns;Seated Horizontal Head Turns    Gaze Exercises  X1 Viewing Horizontal;X1 Viewing Vertical      Seated Horizontal Head Turns   Number of Reps   10    Symptom Description   compensatory saccades with head turns to L and R with target 3 feet ahead of patient and 3 targets 12 inches apart      Standing Horizontal Head Turns   Number of Reps   5    Symptom Description   to L and R with use of spotting before turning body quarter turn to L and R.  No UE support, verbal cues to slow sequence down.  Minimal symptoms reported      X1 Viewing Horizontal   Foot Position  standing feet apart, feet together with one UE support    Reps  2    Comments  60 seconds with feet apart with no symptoms, feet  together x 30 seconds with no dizziness but HA and increased imbalance      X1 Viewing Vertical   Foot Position  standing feet apart, feet together with one UE support    Reps  2    Comments  60 seconds with feet apart with mild symptoms; 30 sec with feet together with mild dizziness            PT Education - 03/22/19 0815    Education Details  did not add feet together to HEP this week due to imbalance/symptoms - will add next week.    Person(s) Educated  Patient    Methods  Explanation    Comprehension  Verbalized understanding       PT Short Term Goals - 03/11/19 2040      PT SHORT TERM GOAL #1   Title  Pt will participate in assessment of FGA for falls risk assessment when ambulating.    Baseline  Not assessed to date    Time  4    Period  Weeks    Status  Achieved    Target Date  03/30/19      PT SHORT TERM GOAL #2   Title  Pt will initiate HEP for balance and vestibular hypofunction    Baseline  total A    Time  4    Period  Weeks    Status   Achieved    Target Date  03/30/19        PT Long Term Goals - 03/11/19 2040      PT LONG TERM GOAL #1   Title  Pt will demonstrate independence with final vestibular and balance HEP  (04/29/2019)    Baseline  total A    Time  8    Period  Weeks    Status  New      PT LONG TERM GOAL #2   Title  Pt will demonstrate decreased falls risk during community gait as indicated by 4 point increase in FGA    Baseline  15/30    Time  8    Period  Weeks    Status  New      PT LONG TERM GOAL #3   Title  Pt will demonstrate improved use of VOR as indicated by 2 line difference in DVA    Baseline  3 line difference (7, 4)    Time  8    Period  Weeks    Status  New      PT LONG TERM GOAL #4   Title  Pt will report 0/5 dizziness with quick head turns/nods, quick body turns, reaching down to floor and with fast walking    Baseline  mild dizziness    Time  8    Period  Weeks    Status  New            Plan - 03/23/19 1010    Clinical Impression Statement  Pt demonstrating improved activity tolerance and decreased symptoms today with therapy frequency decreased to 1x/week.  Attempted to upgrade x1 viewing to feet together but pt experienced increased LOB - did not add to HEP this week but will attempt to progress to feet together next week.  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  Added to  HEP.  Will continue to address and progress towards LTG.    Personal Factors and Comorbidities  Comorbidity 3+;Profession;Social Background    Comorbidities  : GI bleed, major depressive disorder, type 2 DM, ADHD, anemia, asthma, cardiomegaly, chronic back pain, chronic neck pain, COPD, HTN, liver failure, renal disorder, schizophrenia and multiple back/neck surgeries    Examination-Activity Limitations   Bend;Locomotion Level;Squat;Stand;Transfers    Examination-Participation Restrictions  Cleaning;Community Activity;Driving    Stability/Clinical Decision Making  Evolving/Moderate complexity    Rehab Potential  Good    PT Frequency  2x / week    PT Duration  8 weeks    PT Treatment/Interventions  ADLs/Self Care Home Management;Canalith Repostioning;Gait training;Functional mobility training;Therapeutic activities;Therapeutic exercise;Balance training;Neuromuscular re-education;Patient/family education;Passive range of motion;Vestibular;Cryotherapy;Moist Heat    PT Next Visit Plan  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 Cellulitis of submandibular region 10/10/2015    Rico Junker, PT, DPT 03/23/19    10:16 AM    [place] [address] [place], [place], [address] Phone: [phone]   Fax:  [phone]  Name: Matthew Montes MRN: [id number] Date of Birth: [birth year]

## 2019-03-25 ENCOUNTER — Encounter: Payer: Medicare Other | Admitting: Physical Therapy

## 2019-03-29 ENCOUNTER — Ambulatory Visit: Payer: Medicare Other | Admitting: Physical Therapy

## 2019-03-29 ENCOUNTER — Encounter: Payer: Self-pay | Admitting: Physical Therapy

## 2019-03-29 ENCOUNTER — Other Ambulatory Visit: Payer: Self-pay

## 2019-03-29 DIAGNOSIS — R42 Dizziness and giddiness: Secondary | ICD-10-CM

## 2019-03-29 DIAGNOSIS — R262 Difficulty in walking, not elsewhere classified: Secondary | ICD-10-CM

## 2019-03-29 DIAGNOSIS — R2681 Unsteadiness on feet: Secondary | ICD-10-CM

## 2019-03-29 DIAGNOSIS — R296 Repeated falls: Secondary | ICD-10-CM

## 2019-03-29 NOTE — Therapy (Signed)
[facility] [address] [place], [place], [address] Phone: [phone]   Fax:  [phone]  Physical Therapy Treatment  Patient Details  Name: Matthew Montes MRN: [id number] Date of Birth: [birth year] Referring Provider (PT): Flo Shanks, MD   Encounter Date: 03/29/2019  PT End of Session - 03/29/19 1310    Visit Number  6    Number of Visits  17    Date for PT Re-Evaluation  04/29/19    Authorization Type  [employer] and Medicaid; 10th visit PN    PT Start Time  0800    PT Stop Time  0848    PT Time Calculation (min)  48 min    Activity Tolerance  Patient tolerated treatment well    Behavior During Therapy  [facility] for tasks assessed/performed       Past Medical History:  Diagnosis Date  . ADHD (attention deficit hyperactivity disorder)   . Asthma   . Cardiomegaly   . Chronic back pain   . Chronic neck pain   . COPD (chronic obstructive pulmonary disease) (HCC)   . Diabetes mellitus without complication (HCC)   . Hypertension   . Liver failure (HCC)   . Renal disorder   . Renal insufficiency   . Schizophrenia [facility])     Past Surgical History:  Procedure Laterality Date  . BACK SURGERY    . NECK SURGERY    . SPINAL FUSION      There were no vitals filed for this visit.  Subjective Assessment - 03/29/19 0806    Subjective  Has had a very busy week and has not been sleeping well - pain and restless leg syndrome.  Has been performing the exercises but having to do it quickly.  A little unsteady this morning.    Pertinent History  : GI bleed, major depressive disorder, type 2 DM, ADHD, anemia, asthma, cardiomegaly, chronic back pain, chronic neck pain, COPD, HTN, liver failure, renal disorder, schizophrenia and multiple back/neck surgeries    Diagnostic tests  MRI    Patient Stated Goals  "help me not bring on the dizziness"    Currently in Pain?  Yes    Pain Onset  More than a month ago                         Vestibular Treatment/Exercise - 03/29/19 0809      Vestibular Treatment/Exercise   Vestibular Treatment Provided  Gaze    Gaze Exercises  X1 Viewing Horizontal;X1 Viewing Vertical      Seated Horizontal Head Turns   Number of Reps   5    Symptom Description   seated on rolling chair going forwards and backwards x 40' x 3 sets with head turns every 3 feet to simulate riding in car and having to turn head to look at other cars/people in lot      X1 Viewing Horizontal   Foot Position  standing feet apart and then feet together with one UE support for balance    Reps  2    Comments  60 seconds using towel around neck to guide neck rotation ROM when feet apart, feet together pt reported moderate symptoms at end of 60 seconds      X1 Viewing Vertical   Foot Position  standing feet apart and then feet together with one UE support for balance    Reps  2    Comments  60  seconds each, mild symptoms         Balance Exercises - 03/29/19 0844      Balance Exercises: Standing   Standing Eyes Opened  Narrow base of support (BOS);Head turns;Solid surface;Other reps (comment)   10 reps; 2 sets head nods/turns       PT Education - 03/29/19 1308    Education Details  updated HEP but advised pt to continue with previous exercises for one more week; will give handout next week    Person(s) Educated  Patient    Methods  Explanation    Comprehension  Verbalized understanding       PT Short Term Goals - 03/11/19 2040      PT SHORT TERM GOAL #1   Title  Pt will participate in assessment of FGA for falls risk assessment when ambulating.    Baseline  Not assessed to date    Time  4    Period  Weeks    Status  Achieved    Target Date  03/30/19      PT SHORT TERM GOAL #2   Title  Pt will initiate HEP for balance and vestibular hypofunction    Baseline  total A    Time  4    Period  Weeks    Status  Achieved    Target Date  03/30/19        PT  Long Term Goals - 03/11/19 2040      PT LONG TERM GOAL #1   Title  Pt will demonstrate independence with final vestibular and balance HEP  (04/29/2019)    Baseline  total A    Time  8    Period  Weeks    Status  New      PT LONG TERM GOAL #2   Title  Pt will demonstrate decreased falls risk during community gait as indicated by 4 point increase in FGA    Baseline  15/30    Time  8    Period  Weeks    Status  New      PT LONG TERM GOAL #3   Title  Pt will demonstrate improved use of VOR as indicated by 2 line difference in DVA    Baseline  3 line difference (7, 4)    Time  8    Period  Weeks    Status  New      PT LONG TERM GOAL #4   Title  Pt will report 0/5 dizziness with quick head turns/nods, quick body turns, reaching down to floor and with fast walking    Baseline  mild dizziness    Time  8    Period  Weeks    Status  New            Plan - 03/29/19 0845    Clinical Impression Statement  Pt able to progress x1 viewing today to feet together with mild symptoms but did not provide to pt for home yet due to pt overly fatigued and not sleeping well this past week; if pt is tolerating activity better next week will provide to pt for HEP.  Reviewed corner balance - continues to be challenging for patient - no change to corner balance this week.  Began to incoporate forwards and backwards movement in sitting with head turns to L and R (seated in rolling chair) to simulate riding in car and turning head; pt reports in addition to riding, turning his head, he also has  to read labels on a car.  Will continue to incorporate in order to improve gaze stabilization for work activities.    Personal Factors and Comorbidities  Comorbidity 3+;Profession;Social Background    Comorbidities  : GI bleed, major depressive disorder, type 2 DM, ADHD, anemia, asthma, cardiomegaly, chronic back pain, chronic neck pain, COPD, HTN, liver failure, renal disorder, schizophrenia and multiple back/neck  surgeries    Examination-Activity Limitations  Bend;Locomotion Level;Squat;Stand;Transfers    Examination-Participation Restrictions  Cleaning;Community Activity;Driving    Stability/Clinical Decision Making  Evolving/Moderate complexity    Rehab Potential  Good    PT Frequency  2x / week    PT Duration  8 weeks    PT Treatment/Interventions  ADLs/Self Care Home Management;Canalith Repostioning;Gait training;Functional mobility training;Therapeutic activities;Therapeutic exercise;Balance training;Neuromuscular re-education;Patient/family education;Passive range of motion;Vestibular;Cryotherapy;Moist Heat    PT Next Visit Plan  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 give pt progressed HEP: progress x1 viewing feet together - progress corner balance and compensatory saccades    Consulted and Agree with Plan of Care  Patient       Patient will benefit from skilled therapeutic intervention in order to improve the following deficits and impairments:  Decreased balance, Decreased range of motion, Difficulty walking, Dizziness  Visit Diagnosis: Unsteadiness on feet  Dizziness and giddiness  Repeated falls  Difficulty in walking, not elsewhere classified     Problem List Patient Active Problem List   Diagnosis Date Noted  . Cellulitis of submandibular region 10/10/2015    Dierdre Highman, PT, DPT 03/29/19    1:31 PM    [facility] [address] [place], [place], [address] Phone: [phone]   Fax:  [phone]  Name: KREED KAUFFMAN MRN: [id number] Date of Birth: 1960/02/23

## 2019-03-29 NOTE — Patient Instructions (Addendum)
Feet Together, Head Motion - Eyes Open    With eyes open, feet together, move head slowly: up and down 10 times keeping your balance. Repeat 2 times per session. Do 1 sessions per day.  Compensatory Strategies: Corrective Saccades    1. Holding two stationary targets placed __12__ inches apart, move eyes to target on left, keep head still. 2. Then move head in direction of target to left, while eyes remain on target. 3/4. Repeat in opposite direction - eyes to the right and then head to the right. Perform sitting. Repeat sequence ___10_ times per session. Do __2__ sessions per day.   Gaze Stabilization: Standing Feet Together    Feet together, keeping eyes on target on wall __3__ feet away, tilt head down and move head side to side for __60__ seconds. REST. Repeat while moving head up and down for _60___ seconds. Do _2___ sessions per day.

## 2019-04-05 ENCOUNTER — Ambulatory Visit: Payer: Medicare Other | Admitting: Physical Therapy

## 2019-04-12 ENCOUNTER — Ambulatory Visit: Payer: Medicare Other | Admitting: Physical Therapy

## 2019-04-12 ENCOUNTER — Encounter: Payer: Medicare Other | Admitting: Physical Therapy

## 2019-04-15 ENCOUNTER — Encounter: Payer: Medicare Other | Admitting: Physical Therapy

## 2019-04-19 ENCOUNTER — Ambulatory Visit: Payer: Medicare Other | Attending: Otolaryngology | Admitting: Physical Therapy

## 2019-04-19 ENCOUNTER — Other Ambulatory Visit: Payer: Self-pay

## 2019-04-19 ENCOUNTER — Encounter: Payer: Self-pay | Admitting: Physical Therapy

## 2019-04-19 VITALS — BP 120/70

## 2019-04-19 DIAGNOSIS — R2681 Unsteadiness on feet: Secondary | ICD-10-CM | POA: Diagnosis not present

## 2019-04-19 DIAGNOSIS — R296 Repeated falls: Secondary | ICD-10-CM

## 2019-04-19 DIAGNOSIS — R262 Difficulty in walking, not elsewhere classified: Secondary | ICD-10-CM

## 2019-04-19 DIAGNOSIS — R42 Dizziness and giddiness: Secondary | ICD-10-CM

## 2019-04-19 NOTE — Therapy (Signed)
[facility] [address] [place], [place], [address] Phone: [phone]   Fax:  [phone]  Physical Therapy Treatment  Patient Details  Name: Matthew Montes MRN: [id number] Date of Birth: 11/24/1960 Referring Provider (PT): Flo Shanks, MD   Encounter Date: 04/19/2019  PT End of Session - 04/19/19 1309    Visit Number  7    Number of Visits  17    Date for PT Re-Evaluation  04/29/19    Authorization Type  [employer] and Medicaid; 10th visit PN    PT Start Time  0803    PT Stop Time  0852    PT Time Calculation (min)  49 min    Activity Tolerance  Patient tolerated treatment well    Behavior During Therapy  [facility] for tasks assessed/performed       Past Medical History:  Diagnosis Date  . ADHD (attention deficit hyperactivity disorder)   . Asthma   . Cardiomegaly   . Chronic back pain   . Chronic neck pain   . COPD (chronic obstructive pulmonary disease) (HCC)   . Diabetes mellitus without complication (HCC)   . Hypertension   . Liver failure (HCC)   . Renal disorder   . Renal insufficiency   . Schizophrenia [facility])     Past Surgical History:  Procedure Laterality Date  . BACK SURGERY    . NECK SURGERY    . SPINAL FUSION      Vitals:   04/19/19 0822  BP: 120/70    Subjective Assessment - 04/19/19 0805    Subjective  Feeling more wobbly this morning and ambulating with antalgic gait - needs a hip replacement.  Has been working long days and wasn't able to make the past two appointments.  Feels like his exercises are helping with the intensity of symptoms but still feeling wobbly.    Pertinent History  : GI bleed, major depressive disorder, type 2 DM, ADHD, anemia, asthma, cardiomegaly, chronic back pain, chronic neck pain, COPD, HTN, liver failure, renal disorder, schizophrenia and multiple back/neck surgeries    Diagnostic tests  MRI    Patient Stated Goals  "help me not bring on the dizziness"    Currently in Pain?  Yes    Pain Onset  More than a month ago                        Vestibular Treatment/Exercise - 04/19/19 0809      Vestibular Treatment/Exercise   Vestibular Treatment Provided  Gaze    Habituation Exercises  Seated Horizontal Head Turns;Seated Vertical Head Turns;Seated Diagonal Head Turns    Gaze Exercises  X1 Viewing Horizontal;X1 Viewing Vertical      Seated Horizontal Head Turns   Symptom Description   seated in w/c going forwards and backwards down a hallway while performing various head turns to find and read small targets on L and R at various heights.  Started with slow movement forwards and backwards progressing to faster speed.  4 laps down and back with rest breaks due to HA      Seated Vertical Head Turns   Symptom Description   seated in w/c going forwards and backwards down a hallway while performing various head turns to find and read small targets on L and R at various heights.  Started with slow movement forwards and backwards progressing to faster speed.  4 laps down and back with rest breaks due to  HA      Seated Diagonal Head Turns   Symptoms Description   seated in w/c going forwards and backwards down a hallway while performing various head turns to find and read small targets on L and R at various heights.  Started with slow movement forwards and backwards progressing to faster speed.  4 laps down and back with rest breaks due to HA      X1 Viewing Horizontal   Foot Position  feet apart    Reps  2    Comments  30 seconds with mild symptoms, only able to tolerate 30 seconds today and required UE support due to feeling wobbly and hip pain      X1 Viewing Vertical   Foot Position  feet apart    Reps  2    Comments  30 seconds with mild symptoms, only able to tolerate 30 seconds today and required UE support due to feeling wobbly and hip pain            PT Education - 04/19/19 1308    Education Details  did not progress  x1 viewing today due to increase in symptoms today    Person(s) Educated  Patient    Methods  Explanation    Comprehension  Verbalized understanding       PT Short Term Goals - 03/11/19 2040      PT SHORT TERM GOAL #1   Title  Pt will participate in assessment of FGA for falls risk assessment when ambulating.    Baseline  Not assessed to date    Time  4    Period  Weeks    Status  Achieved    Target Date  03/30/19      PT SHORT TERM GOAL #2   Title  Pt will initiate HEP for balance and vestibular hypofunction    Baseline  total A    Time  4    Period  Weeks    Status  Achieved    Target Date  03/30/19        PT Long Term Goals - 03/11/19 2040      PT LONG TERM GOAL #1   Title  Pt will demonstrate independence with final vestibular and balance HEP  (04/29/2019)    Baseline  total A    Time  8    Period  Weeks    Status  New      PT LONG TERM GOAL #2   Title  Pt will demonstrate decreased falls risk during community gait as indicated by 4 point increase in FGA    Baseline  15/30    Time  8    Period  Weeks    Status  New      PT LONG TERM GOAL #3   Title  Pt will demonstrate improved use of VOR as indicated by 2 line difference in DVA    Baseline  3 line difference (7, 4)    Time  8    Period  Weeks    Status  New      PT LONG TERM GOAL #4   Title  Pt will report 0/5 dizziness with quick head turns/nods, quick body turns, reaching down to floor and with fast walking    Baseline  mild dizziness    Time  8    Period  Weeks    Status  New            Plan - 04/19/19  1310    Clinical Impression Statement  Pt having increased dizziness and imbalance today and having greater difficulty with x1 viewing in standing today.  Unable to progress x1 today.  Continued to address motion sensitivity with riding in car and having to read small print to L and R with pt reporting headache after performing but no dizziness.  More visits added as pt will benefit from  continued skilled PT services to address ongoing vestibular and balance impairments.    Personal Factors and Comorbidities  Comorbidity 3+;Profession;Social Background    Comorbidities  : GI bleed, major depressive disorder, type 2 DM, ADHD, anemia, asthma, cardiomegaly, chronic back pain, chronic neck pain, COPD, HTN, liver failure, renal disorder, schizophrenia and multiple back/neck surgeries    Examination-Activity Limitations  Bend;Locomotion Level;Squat;Stand;Transfers    Examination-Participation Restrictions  Cleaning;Community Activity;Driving    Stability/Clinical Decision Making  Evolving/Moderate complexity    Rehab Potential  Good    PT Frequency  2x / week    PT Duration  8 weeks    PT Treatment/Interventions  ADLs/Self Care Home Management;Canalith Repostioning;Gait training;Functional mobility training;Therapeutic activities;Therapeutic exercise;Balance training;Neuromuscular re-education;Patient/family education;Passive range of motion;Vestibular;Cryotherapy;Moist Heat    PT Next Visit Plan  check LTG and recert.  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 give pt progressed HEP: progress x1 viewing feet together - progress corner balance and compensatory saccades    Consulted and Agree with Plan of Care  Patient       Patient will benefit from skilled therapeutic intervention in order to improve the following deficits and impairments:  Decreased balance, Decreased range of motion, Difficulty walking, Dizziness  Visit Diagnosis: Unsteadiness on feet  Dizziness and giddiness  Repeated falls  Difficulty in walking, not elsewhere classified     Problem List Patient Active Problem List   Diagnosis Date Noted  . Cellulitis of submandibular region 10/10/2015    Dierdre Highman, PT, DPT 04/19/19    1:16 PM    [facility] [address] [place], [place], [address] Phone: [phone]   Fax:  [phone]  Name: Matthew Montes MRN: [id number] Date of Birth: 11-18-1960

## 2019-05-10 ENCOUNTER — Encounter: Payer: Self-pay | Admitting: Physical Therapy

## 2019-05-10 ENCOUNTER — Ambulatory Visit: Payer: Medicare Other | Admitting: Physical Therapy

## 2019-05-10 ENCOUNTER — Other Ambulatory Visit: Payer: Self-pay

## 2019-05-10 DIAGNOSIS — R2681 Unsteadiness on feet: Secondary | ICD-10-CM | POA: Diagnosis not present

## 2019-05-10 DIAGNOSIS — R296 Repeated falls: Secondary | ICD-10-CM

## 2019-05-10 DIAGNOSIS — R262 Difficulty in walking, not elsewhere classified: Secondary | ICD-10-CM

## 2019-05-10 DIAGNOSIS — R42 Dizziness and giddiness: Secondary | ICD-10-CM

## 2019-05-10 NOTE — Therapy (Signed)
[facility] [address] [place], [place], [address] Phone: [phone]   Fax:  [phone]  Physical Therapy Treatment  Patient Details  Name: ELIGHA KMETZ MRN: [id number] Date of Birth: 1960-09-10 Referring Provider (PT): Jodi Marble, MD   Encounter Date: 05/10/2019  PT End of Session - 05/10/19 1002    Visit Number  8    Number of Visits  18    Date for PT Re-Evaluation  07/19/19    Authorization Type  [employer] and Medicaid; 10th visit PN    Progress Note Due on Visit  10    PT Start Time  0800    PT Stop Time  0845    PT Time Calculation (min)  45 min    Activity Tolerance  Patient tolerated treatment well    Behavior During Therapy  [facility] for tasks assessed/performed       Past Medical History:  Diagnosis Date  . ADHD (attention deficit hyperactivity disorder)   . Asthma   . Cardiomegaly   . Chronic back pain   . Chronic neck pain   . COPD (chronic obstructive pulmonary disease) ([place])   . Diabetes mellitus without complication ([place])   . Hypertension   . Liver failure ([place])   . Renal disorder   . Renal insufficiency   . Schizophrenia [facility])     Past Surgical History:  Procedure Laterality Date  . BACK SURGERY    . NECK SURGERY    . SPINAL FUSION      There were no vitals filed for this visit.  Subjective Assessment - 05/10/19 0806    Subjective  Has had a lot of appointments with [facility] - nothing new to report, no new medications.  Dizziness is getting better, a little wobbly this morning but dizziness is better.    Pertinent History  : GI bleed, major depressive disorder, type 2 DM, ADHD, anemia, asthma, cardiomegaly, chronic back pain, chronic neck pain, COPD, HTN, liver failure, renal disorder, schizophrenia and multiple back/neck surgeries    Diagnostic tests  MRI    Patient Stated Goals  "help me not bring on the dizziness"    Currently in Pain?  Yes    Pain Location  Hip    Pain  Orientation  Right    Pain Onset  More than a month ago         [facility] PT Assessment - 05/10/19 0810      Assessment   Medical Diagnosis  vertigo    Referring Provider (PT)  Jodi Marble, MD    Onset Date/Surgical Date  12/08/18    Prior Therapy  yes      Precautions   Precautions  Other (comment)    Precaution Comments  : GI bleed, major depressive disorder, type 2 DM, ADHD, anemia, asthma, cardiomegaly, chronic back pain, chronic neck pain, COPD, HTN, liver failure, renal disorder, schizophrenia and multiple back/neck surgeries      Prior Function   Level of Independence  Independent    Vocation  Part time employment    Vocation Requirements  [employer] - in and out of vehicles, walking a lot      Observation/Other Assessments   Focus on Therapeutic Outcomes (FOTO)   N/A      Functional Gait  Assessment   Gait assessed   Yes    Gait Level Surface  Walks 20 ft, slow speed, abnormal gait pattern, evidence for imbalance or deviates 10-15 in outside  of the 12 in walkway width. Requires more than 7 sec to ambulate 20 ft.    Change in Gait Speed  Able to change speed, demonstrates mild gait deviations, deviates 6-10 in outside of the 12 in walkway width, or no gait deviations, unable to achieve a major change in velocity, or uses a change in velocity, or uses an assistive device.    Gait with Horizontal Head Turns  Performs head turns smoothly with no change in gait. Deviates no more than 6 in outside 12 in walkway width    Gait with Vertical Head Turns  Performs head turns with no change in gait. Deviates no more than 6 in outside 12 in walkway width.    Gait and Pivot Turn  Pivot turns safely in greater than 3 sec and stops with no loss of balance, or pivot turns safely within 3 sec and stops with mild imbalance, requires small steps to catch balance.    Step Over Obstacle  Is able to step over one shoe box (4.5 in total height) but must slow down and adjust steps to clear  box safely. May require verbal cueing.    Gait with Narrow Base of Support  Ambulates less than 4 steps heel to toe or cannot perform without assistance.    Gait with Eyes Closed  Cannot walk 20 ft without assistance, severe gait deviations or imbalance, deviates greater than 15 in outside 12 in walkway width or will not attempt task.    Ambulating Backwards  Walks 20 ft, slow speed, abnormal gait pattern, evidence for imbalance, deviates 10-15 in outside 12 in walkway width.    Steps  Alternating feet, must use rail.    Total Score  15    FGA comment:  15/30 - only dizziness was with looking from up > down - very mild.  Main limitation today was due to R hip pain.         Vestibular Assessment - 05/10/19 0822      Visual Acuity   Static  7    Dynamic  4   3 line difference     Positional Sensitivities   Nose to Right Knee  No dizziness    Right Knee to Sitting  No dizziness    Nose to Left Knee  No dizziness    Left Knee to Sitting  No dizziness    Head Turning x 5  Lightheadedness    Head Nodding x 5  No dizziness    Pivot Right in Standing  Lightheadedness    Pivot Left in Standing  Mild dizziness                       PT Education - 05/10/19 0957    Education Details  Progress and lack of progress towards specific goals; areas to continue to focus on for recertification    Person(s) Educated  Patient    Methods  Explanation    Comprehension  Verbalized understanding       PT Short Term Goals - 03/11/19 2040      PT SHORT TERM GOAL #1   Title  Pt will participate in assessment of FGA for falls risk assessment when ambulating.    Baseline  Not assessed to date    Time  4    Period  Weeks    Status  Achieved    Target Date  03/30/19      PT SHORT TERM GOAL #2  Title  Pt will initiate HEP for balance and vestibular hypofunction    Baseline  total A    Time  4    Period  Weeks    Status  Achieved    Target Date  03/30/19        PT Long Term  Goals - 05/10/19 0254      PT LONG TERM GOAL #1   Title  Pt will demonstrate independence with final vestibular and balance HEP  (04/29/2019)    Baseline  total A    Time  8    Period  Weeks    Status  On-going      PT LONG TERM GOAL #2   Title  Pt will demonstrate decreased falls risk during community gait as indicated by 4 point increase in FGA    Baseline  15/30 - but dizziness was better, limited by hip pain today    Time  8    Period  Weeks    Status  Not Met      PT LONG TERM GOAL #3   Title  Pt will demonstrate improved use of VOR as indicated by 2 line difference in DVA    Baseline  3 line difference (7, 4)    Time  8    Period  Weeks    Status  Not Met      PT LONG TERM GOAL #4   Title  Pt will report 0/5 dizziness with quick head turns/nods, quick body turns, reaching down to floor and with fast walking    Baseline  no dizziness with reaching down to floor or with head nods; lightheadedness with head turns, mild dizziness with quick body turns to L    Time  8    Period  Weeks    Status  Partially Met       New goals for recertification: PT Short Term Goals - 05/10/19 1009      PT SHORT TERM GOAL #1   Title  Pt will improve FGA to >/= 18/30 to indicate decreased falls risk    Baseline  15/30    Time  5    Period  Weeks    Status  Revised    Target Date  06/14/19      PT SHORT TERM GOAL #2   Title  Pt will demonstrate independence with updated HEP    Time  5    Period  Weeks    Status  Revised    Target Date  06/14/19      PT Long Term Goals - 05/10/19 1010      PT LONG TERM GOAL #1   Title  Pt will demonstrate independence with final vestibular and balance HEP    Time  10    Period  Weeks    Status  Revised    Target Date  07/19/19      PT LONG TERM GOAL #2   Title  Pt will demonstrate decreased falls risk during community gait as indicated by increase in FGA score to >/= 20/30    Time  10    Period  Weeks    Status  Revised    Target Date   07/19/19      PT LONG TERM GOAL #3   Title  Pt will demonstrate improved use of VOR as indicated by 2 line difference in DVA    Baseline  3 line difference (7, 4)    Time  10  Period  Weeks    Status  Revised    Target Date  07/19/19      PT LONG TERM GOAL #4   Title  Pt will report 0/5 dizziness with quick head turns/nods, quick body turns, reaching down to floor and with fast walking    Baseline  no dizziness with reaching down to floor or with head nods; lightheadedness with head turns, mild dizziness with quick body turns to L    Time  10    Period  Weeks    Status  Revised    Target Date  07/19/19      PT LONG TERM GOAL #5   Title  Pt will report ability to ride around at work and identify tags in cars with no more than 1/5 dizziness    Time  10    Period  Weeks    Status  New    Target Date  07/19/19           Plan - 05/10/19 1003    Clinical Impression Statement  Treatment session focused on assessment of progress towards LTG after pt not being able to attend therapy for two weeks due to pt's work schedule and therapist's schedule.  Pt is making slow but steady progress towards LTG.  Pt did not meet any LTG today, partially met 1 goal and HEP goal is ongoing - will check at next visit.  Pt is demonstrating decreased motion sensitivity and reports that it takes more activity to trigger his symptoms at work now - therapist anticipates that pt would have met FGA goal today but was mainly limited by severe R hip pain and weakness today.  Pt will benefit from continued skilled PT services to address vestibular and balance impairments to maximize functional mobility independence, improve ability to perform job requirements and decrease falls risk.    Personal Factors and Comorbidities  Comorbidity 3+;Profession;Social Background    Comorbidities  : GI bleed, major depressive disorder, type 2 DM, ADHD, anemia, asthma, cardiomegaly, chronic back pain, chronic neck pain, COPD, HTN,  liver failure, renal disorder, schizophrenia and multiple back/neck surgeries    Examination-Activity Limitations  Bend;Locomotion Level;Squat;Stand;Transfers    Examination-Participation Restrictions  Cleaning;Community Activity;Driving    Rehab Potential  Good    PT Frequency  1x / week    PT Duration  Other (comment)   10 weeks   PT Treatment/Interventions  ADLs/Self Care Home Management;Canalith Repostioning;Gait training;Functional mobility training;Therapeutic activities;Therapeutic exercise;Balance training;Neuromuscular re-education;Patient/family education;Passive range of motion;Vestibular;Cryotherapy;Moist Heat    PT Next Visit Plan  Work on habituation with turns.  Gait with head turns/nods.  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 give pt progressed HEP: progress x1 viewing feet together - progress corner balance and compensatory saccades    Consulted and Agree with Plan of Care  Patient       Patient will benefit from skilled therapeutic intervention in order to improve the following deficits and impairments:  Decreased balance, Decreased range of motion, Difficulty walking, Dizziness  Visit Diagnosis: Unsteadiness on feet  Dizziness and giddiness  Repeated falls  Difficulty in walking, not elsewhere classified     Problem List Patient Active Problem List   Diagnosis Date Noted  . Cellulitis of submandibular region 10/10/2015    Rico Junker, PT, DPT 05/10/19    10:09 AM    [facility] [address] [place], [place], [address] Phone: [phone]   Fax:  [phone]  Name: AIDENJAMES HECKMANN MRN: [id number] Date of Birth: December 02, 1960

## 2019-05-31 ENCOUNTER — Ambulatory Visit: Payer: Medicare Other | Attending: Otolaryngology | Admitting: Physical Therapy

## 2019-05-31 ENCOUNTER — Other Ambulatory Visit: Payer: Self-pay

## 2019-05-31 ENCOUNTER — Encounter: Payer: Self-pay | Admitting: Physical Therapy

## 2019-05-31 DIAGNOSIS — R2681 Unsteadiness on feet: Secondary | ICD-10-CM

## 2019-05-31 DIAGNOSIS — R262 Difficulty in walking, not elsewhere classified: Secondary | ICD-10-CM | POA: Diagnosis present

## 2019-05-31 DIAGNOSIS — R42 Dizziness and giddiness: Secondary | ICD-10-CM | POA: Diagnosis present

## 2019-05-31 DIAGNOSIS — R296 Repeated falls: Secondary | ICD-10-CM | POA: Diagnosis present

## 2019-05-31 NOTE — Therapy (Signed)
[facility] [address] [place], [place], [address] Phone: [phone]   Fax:  [phone]  Physical Therapy Treatment  Patient Details  Name: Matthew Montes MRN: [id number] Date of Birth: January 28, 1961 Referring Provider (PT): Flo Shanks, MD   Encounter Date: 05/31/2019  PT End of Session - 05/31/19 1128    Visit Number  9    Number of Visits  18    Date for PT Re-Evaluation  07/19/19    Authorization Type  [employer] and Medicaid; 10th visit PN    Progress Note Due on Visit  10    PT Start Time  0802    PT Stop Time  0845    PT Time Calculation (min)  43 min    Activity Tolerance  Patient limited by fatigue    Behavior During Therapy  [facility] for tasks assessed/performed       Past Medical History:  Diagnosis Date  . ADHD (attention deficit hyperactivity disorder)   . Asthma   . Cardiomegaly   . Chronic back pain   . Chronic neck pain   . COPD (chronic obstructive pulmonary disease) ([facility])   . Diabetes mellitus without complication ([facility])   . Hypertension   . Liver failure ([facility])   . Renal disorder   . Renal insufficiency   . Schizophrenia [facility])     Past Surgical History:  Procedure Laterality Date  . BACK SURGERY    . NECK SURGERY    . SPINAL FUSION      There were no vitals filed for this visit.  Subjective Assessment - 05/31/19 0806    Subjective  Had COVID vaccines; feeling pretty good except for fatigue.  When fatigued or overworked he feels more dizzy but has been using less Meclizine.  Has changed how he is doing things at work and it is helping.    Pertinent History  : GI bleed, major depressive disorder, type 2 DM, ADHD, anemia, asthma, cardiomegaly, chronic back pain, chronic neck pain, COPD, HTN, liver failure, renal disorder, schizophrenia and multiple back/neck surgeries    Diagnostic tests  MRI    Patient Stated Goals  "help me not bring on the dizziness"    Currently in Pain?  Yes    Pain  Location  Hip    Pain Orientation  Right    Pain Descriptors / Indicators  Sore    Pain Onset  More than a month ago                        Vestibular Treatment/Exercise - 05/31/19 0818      Vestibular Treatment/Exercise   Vestibular Treatment Provided  Gaze    Gaze Exercises  X1 Viewing Horizontal;X1 Viewing Vertical;Eye/Head Exercise Horizontal;Eye/Head Exercise Vertical      X1 Viewing Horizontal   Foot Position  feet apart    Reps  2    Comments  60 seconds, second set increased speed.  Mild symptoms with faster speed      X1 Viewing Vertical   Foot Position  feet apart    Comments  60 seconds, second set increased speed.  Mild symptoms with faster speed      Eye/Head Exercise Horizontal   Foot Position  standing feet apart    Reps  10    Comments  2 sets, compensatory saccades.  Target 3 feet in front of patient taped 12" apart.      Eye/Head Exercise Vertical  Foot Position  standing feet apart    Reps  10    Comments  2 sets, compensatory saccades.  Target 3 feet in front of patient taped 12" apart.         Balance Exercises - 05/31/19 0842      Balance Exercises: Standing   Standing Eyes Opened  Narrow base of support (BOS);Solid surface;Other reps (comment)   10 reps lateral/ant/post weight shifting   Tandem Stance  Eyes open;Other reps (comment)   R/L staggered stance, weight shifts, solid surface       PT Education - 05/31/19 1128    Education Details  updated HEP    Person(s) Educated  Patient    Methods  Explanation;Demonstration;Handout    Comprehension  Verbalized understanding;Returned demonstration      Feet Together, Head Motion - Eyes Open    With eyes open, feet together, move head slowly: up and down 10 times keeping your balance. Repeat 2 times per session. Do 1 sessions per day.  Compensatory Strategies: Corrective Saccades    1. Perform in standing: TAPE TO THE WALL - DON'T HOLD -two stationary targets placed  __12__ inches apart, move eyes to target on left, keep head still. Then move head to left, while eyes remain on target. 2. Repeat in opposite direction - eyes to the right and then head to the right. 3.  Also perform same exercise with two cards 12" apart, up and down Repeat sequence ___10_ times per session. Do __2__ sessions per day.   Gaze Stabilization - Tip Card  1.Target must remain in focus, not blurry, and appear stationary while head is in motion. 2.Perform exercises with small head movements (45 to either side of midline). 3.Increase speed of head motion so long as target is in focus. 4.If you wear eyeglasses, be sure you can see target through lens (therapist will give specific instructions for bifocal / progressive lenses). 5.These exercises may provoke dizziness or nausea. Work through these symptoms. If too dizzy, slow head movement slightly. Rest between each exercise. 6.Exercises demand concentration; avoid distractions. 7.For safety, perform standing exercises close to a counter, wall, corner, or next to someone.  Copyright  VHI. All rights reserved.   Gaze Stabilization - Standing Feet Apart   Feet shoulder width apart, keeping eyes on target on wall 3 feet away, tilt head down slightly and move head side to side for 60 seconds. Repeat while moving head up and down for 60 seconds.  Focus on going slightly faster. Do 2-3 sessions per day.     PT Short Term Goals - 05/10/19 1009      PT SHORT TERM GOAL #1   Title  Pt will improve FGA to >/= 18/30 to indicate decreased falls risk    Baseline  15/30    Time  5    Period  Weeks    Status  Revised    Target Date  06/14/19      PT SHORT TERM GOAL #2   Title  Pt will demonstrate independence with updated HEP    Time  5    Period  Weeks    Status  Revised    Target Date  06/14/19        PT Long Term Goals - 05/10/19 1010      PT LONG TERM GOAL #1   Title  Pt will demonstrate independence with final  vestibular and balance HEP    Time  10    Period  Weeks  Status  Revised    Target Date  07/19/19      PT LONG TERM GOAL #2   Title  Pt will demonstrate decreased falls risk during community gait as indicated by increase in FGA score to >/= 20/30    Time  10    Period  Weeks    Status  Revised    Target Date  07/19/19      PT LONG TERM GOAL #3   Title  Pt will demonstrate improved use of VOR as indicated by 2 line difference in DVA    Baseline  3 line difference (7, 4)    Time  10    Period  Weeks    Status  Revised    Target Date  07/19/19      PT LONG TERM GOAL #4   Title  Pt will report 0/5 dizziness with quick head turns/nods, quick body turns, reaching down to floor and with fast walking    Baseline  no dizziness with reaching down to floor or with head nods; lightheadedness with head turns, mild dizziness with quick body turns to L    Time  10    Period  Weeks    Status  Revised    Target Date  07/19/19      PT LONG TERM GOAL #5   Title  Pt will report ability to ride around at work and identify tags in cars with no more than 1/5 dizziness    Time  10    Period  Weeks    Status  New    Target Date  07/19/19            Plan - 05/31/19 1129    Clinical Impression Statement  Pt reporting increased fatigue today and having to go to work after session; in order to avoid significant increase in symptoms treatment session focused on review of HEP and progression of HEP.  Pt able to tolerate progression of x1 vieing and compensatory saccades but became more symptomatic with corner balance.  Did not perform head turns in the corner but focused on weight shifting in varoius directions with narrow BOS.  Pt limited by fatigue today; will continue to progress as pt is able to tolerate.    Personal Factors and Comorbidities  Comorbidity 3+;Profession;Social Background    Comorbidities  : GI bleed, major depressive disorder, type 2 DM, ADHD, anemia, asthma, cardiomegaly,  chronic back pain, chronic neck pain, COPD, HTN, liver failure, renal disorder, schizophrenia and multiple back/neck surgeries    Examination-Activity Limitations  Bend;Locomotion Level;Squat;Stand;Transfers    Examination-Participation Restrictions  Cleaning;Community Activity;Driving    Rehab Potential  Good    PT Frequency  1x / week    PT Duration  Other (comment)   10 weeks   PT Treatment/Interventions  ADLs/Self Care Home Management;Canalith Repostioning;Gait training;Functional mobility training;Therapeutic activities;Therapeutic exercise;Balance training;Neuromuscular re-education;Patient/family education;Passive range of motion;Vestibular;Cryotherapy;Moist Heat    PT Next Visit Plan  10th visit PN.  Work on habituation with turns.  Gait with head turns/nods.  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 Progress x1 viewing to feet together, progress corner balance.    Consulted and Agree with Plan of Care  Patient       Patient will benefit from skilled therapeutic intervention in order to improve the following deficits and impairments:  Decreased balance, Decreased range of  motion, Difficulty walking, Dizziness  Visit Diagnosis: Unsteadiness on feet  Dizziness and giddiness  Repeated falls  Difficulty in walking, not elsewhere classified     Problem List Patient Active Problem List   Diagnosis Date Noted  . Cellulitis of submandibular region 10/10/2015    Dierdre Highman, PT, DPT 05/31/19    11:35 AM    [facility] [address] [place], [place], [address] Phone: [phone]   Fax:  [phone]  Name: RAKEEM COLLEY MRN: [id number] Date of Birth: 07/21/1960

## 2019-05-31 NOTE — Patient Instructions (Addendum)
Feet Together, Head Motion - Eyes Open    With eyes open, feet together, move head slowly: up and down 10 times keeping your balance. Repeat 2 times per session. Do 1 sessions per day.  Compensatory Strategies: Corrective Saccades    1. Perform in standing: TAPE TO THE WALL - DON'T HOLD -two stationary targets placed __12__ inches apart, move eyes to target on left, keep head still. Then move head to left, while eyes remain on target. 2. Repeat in opposite direction - eyes to the right and then head to the right. 3.  Also perform same exercise with two cards 12" apart, up and down Repeat sequence ___10_ times per session. Do __2__ sessions per day.   Gaze Stabilization - Tip Card  1.Target must remain in focus, not blurry, and appear stationary while head is in motion. 2.Perform exercises with small head movements (45 to either side of midline). 3.Increase speed of head motion so long as target is in focus. 4.If you wear eyeglasses, be sure you can see target through lens (therapist will give specific instructions for bifocal / progressive lenses). 5.These exercises may provoke dizziness or nausea. Work through these symptoms. If too dizzy, slow head movement slightly. Rest between each exercise. 6.Exercises demand concentration; avoid distractions. 7.For safety, perform standing exercises close to a counter, wall, corner, or next to someone.  Copyright  VHI. All rights reserved.   Gaze Stabilization - Standing Feet Apart   Feet shoulder width apart, keeping eyes on target on wall 3 feet away, tilt head down slightly and move head side to side for 60 seconds. Repeat while moving head up and down for 60 seconds.  Focus on going slightly faster. Do 2-3 sessions per day.

## 2019-06-07 ENCOUNTER — Encounter: Payer: Self-pay | Admitting: Physical Therapy

## 2019-06-07 ENCOUNTER — Other Ambulatory Visit: Payer: Self-pay

## 2019-06-07 ENCOUNTER — Ambulatory Visit: Payer: Medicare Other | Admitting: Physical Therapy

## 2019-06-07 DIAGNOSIS — R2681 Unsteadiness on feet: Secondary | ICD-10-CM | POA: Diagnosis not present

## 2019-06-07 DIAGNOSIS — R42 Dizziness and giddiness: Secondary | ICD-10-CM

## 2019-06-07 DIAGNOSIS — R262 Difficulty in walking, not elsewhere classified: Secondary | ICD-10-CM

## 2019-06-07 DIAGNOSIS — R296 Repeated falls: Secondary | ICD-10-CM

## 2019-06-07 NOTE — Therapy (Signed)
[facility] [address] [place], [place], [address] Phone: [phone]   Fax:  [phone]  Physical Therapy Treatment and 10th Visit Progress Note  Patient Details  Name: Matthew Montes MRN: [id number] Date of Birth: 09-10-1960 Referring Provider (PT): Flo Shanks, MD   Encounter Date: 06/07/2019   Progress Note Reporting Period 02/28/19 to 06/07/19  See note below for Objective Data and Assessment of Progress/Goals.    PT End of Session - 06/07/19 0847    Visit Number  10    Number of Visits  18    Date for PT Re-Evaluation  07/19/19    Authorization Type  [employer] and Medicaid; 10th visit PN    Progress Note Due on Visit  20    PT Start Time  0800    PT Stop Time  0843    PT Time Calculation (min)  43 min    Activity Tolerance  Patient limited by fatigue;Patient limited by pain    Behavior During Therapy  [facility] for tasks assessed/performed       Past Medical History:  Diagnosis Date  . ADHD (attention deficit hyperactivity disorder)   . Asthma   . Cardiomegaly   . Chronic back pain   . Chronic neck pain   . COPD (chronic obstructive pulmonary disease) (HCC)   . Diabetes mellitus without complication (HCC)   . Hypertension   . Liver failure (HCC)   . Renal disorder   . Renal insufficiency   . Schizophrenia [facility])     Past Surgical History:  Procedure Laterality Date  . BACK SURGERY    . NECK SURGERY    . SPINAL FUSION      There were no vitals filed for this visit.  Subjective Assessment - 06/07/19 0803    Subjective  Has been a very busy week preparing for the [employer] and has been giving a neighbor a ride to work.  Very fatigued today and very off balance today.    Pertinent History  : GI bleed, major depressive disorder, type 2 DM, ADHD, anemia, asthma, cardiomegaly, chronic back pain, chronic neck pain, COPD, HTN, liver failure, renal disorder, schizophrenia and multiple back/neck  surgeries    Diagnostic tests  MRI    Patient Stated Goals  "help me not bring on the dizziness"    Currently in Pain?  Yes    Pain Score  2     Pain Location  Hip    Pain Orientation  Right    Pain Descriptors / Indicators  Aching    Pain Onset  More than a month ago         [facility] PT Assessment - 06/07/19 0809      Functional Gait  Assessment   Gait assessed   Yes    Gait Level Surface  Walks 20 ft, slow speed, abnormal gait pattern, evidence for imbalance or deviates 10-15 in outside of the 12 in walkway width. Requires more than 7 sec to ambulate 20 ft.    Change in Gait Speed  Able to smoothly change walking speed without loss of balance or gait deviation. Deviate no more than 6 in outside of the 12 in walkway width.    Gait with Horizontal Head Turns  Performs head turns smoothly with no change in gait. Deviates no more than 6 in outside 12 in walkway width    Gait with Vertical Head Turns  Performs head turns with no change in gait.  Deviates no more than 6 in outside 12 in walkway width.    Gait and Pivot Turn  Pivot turns safely within 3 sec and stops quickly with no loss of balance.    Step Over Obstacle  Is able to step over one shoe box (4.5 in total height) without changing gait speed. No evidence of imbalance.    Gait with Narrow Base of Support  Ambulates less than 4 steps heel to toe or cannot perform without assistance.    Gait with Eyes Closed  Walks 20 ft, slow speed, abnormal gait pattern, evidence for imbalance, deviates 10-15 in outside 12 in walkway width. Requires more than 9 sec to ambulate 20 ft.    Ambulating Backwards  Walks 20 ft, uses assistive device, slower speed, mild gait deviations, deviates 6-10 in outside 12 in walkway width.    Steps  Alternating feet, must use rail.    Total Score  20    FGA comment:  20/30 with dizziness with turns, vertical head movements.  Off balance with walking backwards.  Still limited by hip pain                     Vestibular Treatment/Exercise - 06/07/19 0829      Vestibular Treatment/Exercise   Vestibular Treatment Provided  Gaze;Habituation    Gaze Exercises  X1 Viewing Horizontal;X1 Viewing Vertical;Eye/Head Exercise Horizontal;Eye/Head Exercise Vertical      X1 Viewing Horizontal   Foot Position  attempted feet together but pt had LOB; returned to feet apart and focused in increasing speed of head turn    Reps  1    Comments  60 seconds, mild symptoms      X1 Viewing Vertical   Foot Position  feet apart, faster speed    Reps  1    Comments  60 seconds, moderate symptoms; LOB posteriorly         Balance Exercises - 06/07/19 0838      Balance Exercises: Standing   Turning  Right;Left;3 reps   90 turns, no UE support, moderate symptoms, 2 sets       PT Education - 06/07/19 0846    Education Details  Progress based on FGA    Person(s) Educated  Patient    Methods  Explanation    Comprehension  Verbalized understanding       PT Short Term Goals - 06/07/19 0847      PT SHORT TERM GOAL #1   Title  Pt will improve FGA to >/= 18/30 to indicate decreased falls risk    Baseline  15/30 > 20/30    Time  5    Period  Weeks    Status  Achieved    Target Date  06/14/19      PT SHORT TERM GOAL #2   Title  Pt will demonstrate independence with updated HEP    Time  5    Period  Weeks    Status  Achieved    Target Date  06/14/19        PT Long Term Goals - 05/10/19 1010      PT LONG TERM GOAL #1   Title  Pt will demonstrate independence with final vestibular and balance HEP    Time  10    Period  Weeks    Status  Revised    Target Date  07/19/19      PT LONG TERM GOAL #2   Title  Pt will demonstrate decreased  falls risk during community gait as indicated by increase in FGA score to >/= 20/30    Time  10    Period  Weeks    Status  Revised    Target Date  07/19/19      PT LONG TERM GOAL #3   Title  Pt will demonstrate improved use of VOR  as indicated by 2 line difference in DVA    Baseline  3 line difference (7, 4)    Time  10    Period  Weeks    Status  Revised    Target Date  07/19/19      PT LONG TERM GOAL #4   Title  Pt will report 0/5 dizziness with quick head turns/nods, quick body turns, reaching down to floor and with fast walking    Baseline  no dizziness with reaching down to floor or with head nods; lightheadedness with head turns, mild dizziness with quick body turns to L    Time  10    Period  Weeks    Status  Revised    Target Date  07/19/19      PT LONG TERM GOAL #5   Title  Pt will report ability to ride around at work and identify tags in cars with no more than 1/5 dizziness    Time  10    Period  Weeks    Status  New    Target Date  07/19/19            Plan - 06/07/19 0948    Clinical Impression Statement  Performed assessment of STG with pt meeting both STG.  Pt is making steady progress and demonstrates decreased motion sensitivity, improved balance and decreased falls risk as indicated by FGA.  Pt continues to demonstrate symptoms of dizziness with quick head and body turns and vertical head movements.  Will continue to address and progress towards LTG.    Personal Factors and Comorbidities  Comorbidity 3+;Profession;Social Background    Comorbidities  : GI bleed, major depressive disorder, type 2 DM, ADHD, anemia, asthma, cardiomegaly, chronic back pain, chronic neck pain, COPD, HTN, liver failure, renal disorder, schizophrenia and multiple back/neck surgeries    Examination-Activity Limitations  Bend;Locomotion Level;Squat;Stand;Transfers    Examination-Participation Restrictions  Cleaning;Community Activity;Driving    Rehab Potential  Good    PT Frequency  1x / week    PT Duration  Other (comment)   10 weeks   PT Treatment/Interventions  ADLs/Self Care Home Management;Canalith Repostioning;Gait training;Functional mobility training;Therapeutic activities;Therapeutic exercise;Balance  training;Neuromuscular re-education;Patient/family education;Passive range of motion;Vestibular;Cryotherapy;Moist Heat    PT Next Visit Plan  Work on habituation with turns and add to HEP.  Gait with head turns/nods.  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 Progress x1 viewing to feet together, progress corner balance.    Consulted and Agree with Plan of Care  Patient       Patient will benefit from skilled therapeutic intervention in order to improve the following deficits and impairments:  Decreased balance, Decreased range of motion, Difficulty walking, Dizziness  Visit Diagnosis: Unsteadiness on feet  Dizziness and giddiness  Repeated falls  Difficulty in walking, not elsewhere classified     Problem List Patient Active Problem List   Diagnosis Date Noted  . Cellulitis of submandibular region 10/10/2015    Dierdre Highman, PT, DPT 06/07/19  12:09 PM    [facility] [address] [place], [place], [address] Phone: [phone]   Fax:  [phone]  Name: RUBIN DAIS MRN: [id number] Date of Birth: 10-14-1960

## 2019-06-14 ENCOUNTER — Ambulatory Visit: Payer: Medicare Other | Admitting: Physical Therapy

## 2019-06-14 ENCOUNTER — Telehealth: Payer: Self-pay | Admitting: Physical Therapy

## 2019-06-14 NOTE — Telephone Encounter (Signed)
Contacted pt about missed visit this morning.  Pt reports he overslept.  Reminded pt about visit next Friday.  Pt apologized and stated he would be present for next week's appointment.  Dierdre Highman, PT, DPT 06/14/19    8:25 AM

## 2019-06-21 ENCOUNTER — Other Ambulatory Visit: Payer: Self-pay

## 2019-06-21 ENCOUNTER — Encounter: Payer: Self-pay | Admitting: Physical Therapy

## 2019-06-21 ENCOUNTER — Ambulatory Visit: Payer: Medicare Other | Attending: Otolaryngology | Admitting: Physical Therapy

## 2019-06-21 DIAGNOSIS — R296 Repeated falls: Secondary | ICD-10-CM

## 2019-06-21 DIAGNOSIS — R262 Difficulty in walking, not elsewhere classified: Secondary | ICD-10-CM | POA: Diagnosis present

## 2019-06-21 DIAGNOSIS — R2681 Unsteadiness on feet: Secondary | ICD-10-CM | POA: Diagnosis not present

## 2019-06-21 DIAGNOSIS — R42 Dizziness and giddiness: Secondary | ICD-10-CM | POA: Diagnosis present

## 2019-06-21 NOTE — Patient Instructions (Addendum)
Feet Together, Head Motion - Eyes Open    With eyes open, feet together, move head slowly: up and down 10 times keeping your balance. Repeat 2 times per session. Do 1 sessions per day.  Compensatory Strategies: Corrective Saccades    1. Perform in standing: TAPE TO THE WALL - DON'T HOLD -two stationary targets placed __12__ inches apart, move eyes to target on left, keep head still. Then move head to left, while eyes remain on target. 2. Repeat in opposite direction - eyes to the right and then head to the right. 3.  Also perform same exercise with two cards 12" apart, up and down Repeat sequence ___10_ times per session. Do __2__ sessions per day.    Turning in Place: Solid Surface    Standing in place, lead with head and turn slowly making 180 deg turns to the left, stop and stabilize and then turn back to the right. Turn slowly to the right 180 deg stop and stabilize and then turn back to the left.    Repeat __3-5__ times per session. Do ____ sessions per day.    Gaze Stabilization: Standing Feet Apart (Compliant Surface)    Feet apart on pillow, keeping eyes on target on wall __3__ feet away, tilt head down 15-30 and move head side to side for __60__ seconds. Repeat while moving head up and down for __60__ seconds.

## 2019-06-21 NOTE — Therapy (Addendum)
[facility] [address] [place], [place], [address] Phone: [phone]   Fax:  [phone]  Physical Therapy Treatment  Patient Details  Name: Matthew Montes MRN: [id number] Date of Birth: January 22, 1961 Referring Provider (PT): Jodi Marble, MD   Encounter Date: 06/21/2019  PT End of Session - 06/21/19 0846    Visit Number  11    Number of Visits  18    Date for PT Re-Evaluation  07/19/19    Authorization Type  [employer] and Medicaid; 10th visit PN    Progress Note Due on Visit  20    PT Start Time  0805    PT Stop Time  0848    PT Time Calculation (min)  43 min    Activity Tolerance  Patient tolerated treatment well    Behavior During Therapy  [facility] for tasks assessed/performed       Past Medical History:  Diagnosis Date  . ADHD (attention deficit hyperactivity disorder)   . Asthma   . Cardiomegaly   . Chronic back pain   . Chronic neck pain   . COPD (chronic obstructive pulmonary disease) ([place])   . Diabetes mellitus without complication ([place])   . Hypertension   . Liver failure ([place])   . Renal disorder   . Renal insufficiency   . Schizophrenia [facility])     Past Surgical History:  Procedure Laterality Date  . BACK SURGERY    . NECK SURGERY    . SPINAL FUSION      There were no vitals filed for this visit.  Subjective Assessment - 06/21/19 0805    Subjective  Pt still very fatigued - "that injection kicked my butt."  Having more low back pain now that hip pain is better.  Dizziness is about the same.    Pertinent History  : GI bleed, major depressive disorder, type 2 DM, ADHD, anemia, asthma, cardiomegaly, chronic back pain, chronic neck pain, COPD, HTN, liver failure, renal disorder, schizophrenia and multiple back/neck surgeries    Diagnostic tests  MRI    Patient Stated Goals  "help me not bring on the dizziness"    Currently in Pain?  Yes   more back pain, less hip pain.   Pain Onset  More than a month  ago                        Vestibular Treatment/Exercise - 06/21/19 0810      Vestibular Treatment/Exercise   Vestibular Treatment Provided  Gaze;Habituation      X1 Viewing Horizontal   Foot Position  feet apart, feet together; changed to feet apart on pillow due to hip pain    Reps  3    Comments  60 seconds - no symptoms with feet apart, mild with feet together.  Mild symptoms with pillow      X1 Viewing Vertical   Foot Position  feet apart, feet together - changed to feet apart on pillow    Comments  60 seconds, mild symptoms but increase in hip pain with feet together.  Mild symptoms with pillow         Balance Exercises - 06/21/19 0830      Balance Exercises: Standing   Turning  Right;Left;3 reps;Other (comment)   180 standing, 180 with walking 10'       PT Education - 06/21/19 0845    Education Details  progressed HEP    Person(s) Educated  Patient    Methods  Explanation;Demonstration;Handout    Comprehension  Verbalized understanding;Returned demonstration      Feet Together, Head Motion - Eyes Open    With eyes open, feet together, move head slowly: up and down 10 times keeping your balance. Repeat 2 times per session. Do 1 sessions per day.  Compensatory Strategies: Corrective Saccades    1. Perform in standing: TAPE TO THE WALL - DON'T HOLD -two stationary targets placed __12__ inches apart, move eyes to target on left, keep head still. Then move head to left, while eyes remain on target. 2. Repeat in opposite direction - eyes to the right and then head to the right. 3.  Also perform same exercise with two cards 12" apart, up and down Repeat sequence ___10_ times per session. Do __2__ sessions per day.    Turning in Place: Solid Surface    Standing in place, lead with head and turn slowly making 180 deg turns to the left, stop and stabilize and then turn back to the right. Turn slowly to the right 180 deg stop and stabilize and  then turn back to the left.    Repeat __3-5__ times per session. Do ____ sessions per day.    Gaze Stabilization: Standing Feet Apart (Compliant Surface)    Feet apart on pillow, keeping eyes on target on wall __3__ feet away, tilt head down 15-30 and move head side to side for __60__ seconds. Repeat while moving head up and down for __60__ seconds.    PT Short Term Goals - 06/07/19 0847      PT SHORT TERM GOAL #1   Title  Pt will improve FGA to >/= 18/30 to indicate decreased falls risk    Baseline  15/30 > 20/30    Time  5    Period  Weeks    Status  Achieved    Target Date  06/14/19      PT SHORT TERM GOAL #2   Title  Pt will demonstrate independence with updated HEP    Time  5    Period  Weeks    Status  Achieved    Target Date  06/14/19        PT Long Term Goals - 05/10/19 1010      PT LONG TERM GOAL #1   Title  Pt will demonstrate independence with final vestibular and balance HEP    Time  10    Period  Weeks    Status  Revised    Target Date  07/19/19      PT LONG TERM GOAL #2   Title  Pt will demonstrate decreased falls risk during community gait as indicated by increase in FGA score to >/= 20/30    Time  10    Period  Weeks    Status  Revised    Target Date  07/19/19      PT LONG TERM GOAL #3   Title  Pt will demonstrate improved use of VOR as indicated by 2 line difference in DVA    Baseline  3 line difference (7, 4)    Time  10    Period  Weeks    Status  Revised    Target Date  07/19/19      PT LONG TERM GOAL #4   Title  Pt will report 0/5 dizziness with quick head turns/nods, quick body turns, reaching down to floor and with fast walking    Baseline  no dizziness  with reaching down to floor or with head nods; lightheadedness with head turns, mild dizziness with quick body turns to L    Time  10    Period  Weeks    Status  Revised    Target Date  07/19/19      PT LONG TERM GOAL #5   Title  Pt will report ability to ride around at work and  identify tags in cars with no more than 1/5 dizziness    Time  10    Period  Weeks    Status  New    Target Date  07/19/19            Plan - 06/21/19 1719    Clinical Impression Statement  Patient demonstrated improved tolerance of x1 viewing but continues to require wider BOS due to increased hip pain when standing with narrow BOS.  Continued to progress habituation to turning and added 180 turns to HEP.  Will continue to progress towards LTG.    Personal Factors and Comorbidities  Comorbidity 3+;Profession;Social Background    Comorbidities  : GI bleed, major depressive disorder, type 2 DM, ADHD, anemia, asthma, cardiomegaly, chronic back pain, chronic neck pain, COPD, HTN, liver failure, renal disorder, schizophrenia and multiple back/neck surgeries    Examination-Activity Limitations  Bend;Locomotion Level;Squat;Stand;Transfers    Examination-Participation Restrictions  Cleaning;Community Activity;Driving    Rehab Potential  Good    PT Frequency  1x / week    PT Duration  Other (comment)   10 weeks   PT Treatment/Interventions  ADLs/Self Care Home Management;Canalith Repostioning;Gait training;Functional mobility training;Therapeutic activities;Therapeutic exercise;Balance training;Neuromuscular re-education;Patient/family education;Passive range of motion;Vestibular;Cryotherapy;Moist Heat    PT Next Visit Plan  Progress HEP but can't do feet together - progress x1 on compliant surface - add busy background?  Add walking with 180 turns to HEP.  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 Cellulitis of submandibular region 10/10/2015    Dierdre Highman, PT, DPT 06/21/19    5:21 PM    [facility] [address] [place], [place], [address] Phone: [phone]   Fax:  [phone]  Name: Matthew Montes MRN: [id number] Date of Birth: 1960-05-04

## 2019-06-28 ENCOUNTER — Ambulatory Visit: Payer: Medicare Other | Admitting: Physical Therapy

## 2019-07-05 ENCOUNTER — Ambulatory Visit: Payer: Medicare Other | Admitting: Physical Therapy

## 2019-07-05 ENCOUNTER — Other Ambulatory Visit: Payer: Self-pay

## 2019-07-05 ENCOUNTER — Encounter: Payer: Self-pay | Admitting: Physical Therapy

## 2019-07-05 DIAGNOSIS — R262 Difficulty in walking, not elsewhere classified: Secondary | ICD-10-CM

## 2019-07-05 DIAGNOSIS — R296 Repeated falls: Secondary | ICD-10-CM

## 2019-07-05 DIAGNOSIS — R2681 Unsteadiness on feet: Secondary | ICD-10-CM | POA: Diagnosis not present

## 2019-07-05 DIAGNOSIS — R42 Dizziness and giddiness: Secondary | ICD-10-CM

## 2019-07-05 NOTE — Patient Instructions (Addendum)
Compensatory Strategies: Corrective Saccades    1. Perform in standing: TAPE TO THE WALL - DON'T HOLD -two stationary targets placed __12__ inches apart, move eyes to target on left, keep head still. Then move head to left, while eyes remain on target. 2. Repeat in opposite direction - eyes to the right and then head to the right. 3.  Also perform same exercise with two cards 12" apart, up and down Repeat sequence ___10_ times per session. Do __2__ sessions per day.    Turning in Place: Solid Surface - Two 90 deg turns    March in place slowly, while marching in place, do a 90 deg turn to the right plus one more (180 total) and then back to the front. Continue to march in place and do a 90 deg turn to the left, plus one more and then back to the front.  Repeat 2-3 times to each side. Do _2___ sessions per day.    Gaze Stabilization: Standing Feet Apart    MOVE YOUR LETTER TO A PATTERN OR BUSY BACKGROUND.  Feet shoulder width apart, keeping eyes on target on wall __3__ feet away, tilt head down 15-30 and move head side to side for _60___ seconds. Repeat while moving head up and down for __60__ seconds. Do __2__ sessions per day.   Feet Together, Head Motion - Eyes Closed    With eyes closed and feet together, move head slowly, up and down 8-10 times.  Rest and then repeat moving head side to side 8-10 times. Repeat __1__ times per session. Do __2__ sessions per day.  Copyright  [employer]. All rights reserved.

## 2019-07-05 NOTE — Therapy (Signed)
[facility]  [facility] [address] [place], [place], [address] Phone: [phone]   Fax:  [phone]  Physical Therapy Treatment  Patient Details  Name: Matthew Montes MRN: [id number] Date of Birth: [birth year] Referring Provider (PT): Flo Shanks, MD   Encounter Date: 07/05/2019  PT End of Session - 07/05/19 0845    Visit Number  12    Number of Visits  18    Date for PT Re-Evaluation  07/19/19    Authorization Type  [employer] -[employer] and Medicaid; 10th visit PN    Progress Note Due on Visit  20    PT Start Time  0805    PT Stop Time  0845    PT Time Calculation (min)  40 min    Activity Tolerance  Patient limited by fatigue    Behavior During Therapy  [facility] for tasks assessed/performed       Past Medical History:  Diagnosis Date  . ADHD (attention deficit hyperactivity disorder)   . Asthma   . Cardiomegaly   . Chronic back pain   . Chronic neck pain   . COPD (chronic obstructive pulmonary disease) (HCC)   . Diabetes mellitus without complication (HCC)   . Hypertension   . Liver failure (HCC)   . Renal disorder   . Renal insufficiency   . Schizophrenia [facility])     Past Surgical History:  Procedure Laterality Date  . BACK SURGERY    . NECK SURGERY    . SPINAL FUSION      There were no vitals filed for this visit.  Subjective Assessment - 07/05/19 0805    Subjective  Lab work showed Iron levels are low.  Work is still kicking "my butt".  Feels like the dizziness is a little worse, has been taking the Meclizine a little bit more lately.    Pertinent History  : GI bleed, major depressive disorder, type 2 DM, ADHD, anemia, asthma, cardiomegaly, chronic back pain, chronic neck pain, COPD, HTN, liver failure, renal disorder, schizophrenia and multiple back/neck surgeries    Diagnostic tests  MRI    Patient Stated Goals  "help me not bring on the dizziness"    Currently in Pain?  Yes    Pain Onset  More than a month ago                           Vestibular Treatment/Exercise - 07/05/19 0809      Vestibular Treatment/Exercise   Vestibular Treatment Provided  Gaze      X1 Viewing Horizontal   Foot Position  feet apart, solid surface.  Feet apart, compliant surface    Comments  60 seconds, lightheadedness.  With compliant surface performed 30 seconds but pt reporting moderate dizziness.      X1 Viewing Vertical   Foot Position  feet apart, solid surface.  Letter on patterned background, feet apart, solid surface    Comments  60 seconds, lightheaded and mild dizziness at end.  60 seconds with pattern, mild dizziness and eye fatigue         Balance Exercises - 07/05/19 0836      Balance Exercises: Standing   Standing Eyes Closed  Narrow base of support (BOS);Head turns;Solid surface;Other reps (comment)   8 reps, mild dizziness   Turning  Right;Left;3 reps   marching in place, 90 deg > two 90 deg turns      Compensatory Strategies: Corrective Saccades  1. Perform in standing: TAPE TO THE WALL - DON'T HOLD -two stationary targets placed __12__ inches apart, move eyes to target on left, keep head still. Then move head to left, while eyes remain on target. 2. Repeat in opposite direction - eyes to the right and then head to the right. 3.  Also perform same exercise with two cards 12" apart, up and down Repeat sequence ___10_ times per session. Do __2__ sessions per day.    Turning in Place: Solid Surface - Two 90 deg turns    March in place slowly, while marching in place, do a 90 deg turn to the right plus one more (180 total) and then back to the front. Continue to march in place and do a 90 deg turn to the left, plus one more and then back to the front.  Repeat 2-3 times to each side. Do _2___ sessions per day.    Gaze Stabilization: Standing Feet Apart    MOVE YOUR LETTER TO A PATTERN OR BUSY BACKGROUND.  Feet shoulder width apart, keeping eyes on target on wall __3__  feet away, tilt head down 15-30 and move head side to side for _60___ seconds. Repeat while moving head up and down for __60__ seconds. Do __2__ sessions per day.   Feet Together, Head Motion - Eyes Closed    With eyes closed and feet together, move head slowly, up and down 8-10 times.  Rest and then repeat moving head side to side 8-10 times. Repeat __1__ times per session. Do __2__ sessions per day.  Copyright  VHI. All rights reserved.    PT Education - 07/05/19 0845    Education Details  updated HEP    Person(s) Educated  Patient    Methods  Demonstration;Explanation;Handout    Comprehension  Verbalized understanding;Returned demonstration       PT Short Term Goals - 06/07/19 0847      PT SHORT TERM GOAL #1   Title  Pt will improve FGA to >/= 18/30 to indicate decreased falls risk    Baseline  15/30 > 20/30    Time  5    Period  Weeks    Status  Achieved    Target Date  06/14/19      PT SHORT TERM GOAL #2   Title  Pt will demonstrate independence with updated HEP    Time  5    Period  Weeks    Status  Achieved    Target Date  06/14/19        PT Long Term Goals - 05/10/19 1010      PT LONG TERM GOAL #1   Title  Pt will demonstrate independence with final vestibular and balance HEP    Time  10    Period  Weeks    Status  Revised    Target Date  07/19/19      PT LONG TERM GOAL #2   Title  Pt will demonstrate decreased falls risk during community gait as indicated by increase in FGA score to >/= 20/30    Time  10    Period  Weeks    Status  Revised    Target Date  07/19/19      PT LONG TERM GOAL #3   Title  Pt will demonstrate improved use of VOR as indicated by 2 line difference in DVA    Baseline  3 line difference (7, 4)    Time  10    Period  Weeks    Status  Revised    Target Date  07/19/19      PT LONG TERM GOAL #4   Title  Pt will report 0/5 dizziness with quick head turns/nods, quick body turns, reaching down to floor and with fast walking     Baseline  no dizziness with reaching down to floor or with head nods; lightheadedness with head turns, mild dizziness with quick body turns to L    Time  10    Period  Weeks    Status  Revised    Target Date  07/19/19      PT LONG TERM GOAL #5   Title  Pt will report ability to ride around at work and identify tags in cars with no more than 1/5 dizziness    Time  10    Period  Weeks    Status  New    Target Date  07/19/19            Plan - 07/05/19 1228    Clinical Impression Statement  Pt more fatigued today but able to progress x1 viewing to patterned background to introduce more visual flow; not able to tolerate compliant surface.  Due to increased symptoms with 180 turns focused on breaking turns down into two 90 deg turns with improved tolerance.  Pt able to progress corner balance to EC with head turns and narrow BOS.  Will assess LTG next session and determine if pt would benefit from further visits or will be ready for D/C.    Personal Factors and Comorbidities  Comorbidity 3+;Profession;Social Background    Comorbidities  : GI bleed, major depressive disorder, type 2 DM, ADHD, anemia, asthma, cardiomegaly, chronic back pain, chronic neck pain, COPD, HTN, liver failure, renal disorder, schizophrenia and multiple back/neck surgeries    Examination-Activity Limitations  Bend;Locomotion Level;Squat;Stand;Transfers    Examination-Participation Restrictions  Cleaning;Community Activity;Driving    Rehab Potential  Good    PT Frequency  1x / week    PT Duration  Other (comment)   10 weeks   PT Treatment/Interventions  ADLs/Self Care Home Management;Canalith Repostioning;Gait training;Functional mobility training;Therapeutic activities;Therapeutic exercise;Balance training;Neuromuscular re-education;Patient/family education;Passive range of motion;Vestibular;Cryotherapy;Moist Heat    PT Next Visit Plan  check LTG and recert?  progress x1 on compliant surface with busy background?   Add walking with 180 (two 90 deg) turns to HEP.  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 Cellulitis of submandibular region 10/10/2015    Rico Junker, PT, DPT 07/05/19    12:31 PM    [facility] [address] [place], [place], [address] Phone: [phone]   Fax:  [phone]  Name: Matthew Montes MRN: [id number] Date of Birth: 1960/09/29

## 2019-07-12 ENCOUNTER — Ambulatory Visit: Payer: Medicare Other | Admitting: Physical Therapy

## 2019-07-12 ENCOUNTER — Encounter: Payer: Self-pay | Admitting: Physical Therapy

## 2019-07-12 ENCOUNTER — Other Ambulatory Visit: Payer: Self-pay

## 2019-07-12 DIAGNOSIS — R296 Repeated falls: Secondary | ICD-10-CM

## 2019-07-12 DIAGNOSIS — R262 Difficulty in walking, not elsewhere classified: Secondary | ICD-10-CM

## 2019-07-12 DIAGNOSIS — R42 Dizziness and giddiness: Secondary | ICD-10-CM

## 2019-07-12 DIAGNOSIS — R2681 Unsteadiness on feet: Secondary | ICD-10-CM | POA: Diagnosis not present

## 2019-07-12 NOTE — Patient Instructions (Signed)
Compensatory Strategies: Corrective Saccades    1. Perform in standing: TAPE TO THE WALL - DON'T HOLD -two stationary targets placed __12__ inches apart, move eyes to target on left, keep head still. Then move head to left, while eyes remain on target. 2. Repeat in opposite direction - eyes to the right and then head to the right. 3.  Also perform same exercise with two cards 12" apart, up and down Repeat sequence ___10_ times per session. Do __2__ sessions per day.    Turning in Place: Solid Surface - Two 90 deg turns    March in place slowly, while marching in place, do a 90 deg turn to the right plus one more (180 total) and then back to the front. Continue to march in place and do a 90 deg turn to the left, plus one more and then back to the front.  Repeat 2-3 times to each side. Do _2___ sessions per day. Progress to doing a full 360 turn to left and then right but breaking it into 4 - 90 degree turns.    Gaze Stabilization: Standing Feet Apart    MOVE YOUR LETTER TO A PATTERN OR BUSY BACKGROUND.  Feet shoulder width apart, keeping eyes on target on wall __3__ feet away, tilt head down 15-30 and move head side to side for _60___ seconds. Repeat while moving head up and down for __60__ seconds. Do __2__ sessions per day.  TO progress this exercise - keep feet apart but stand on something soft (pillow or cushion).  Busy background  60 seconds each side to side and up and down.   Feet Together, Head Motion - Eyes Closed    With eyes closed and feet together, move head slowly, up and down 10 times.  Rest and then repeat moving head side to side 10 times. Repeat __1__ times per session. Do __2__ sessions per day.  To progress this exercise - stand with feet apart on soft surface - pillow or cushion.

## 2019-07-12 NOTE — Therapy (Signed)
[facility] [address] [place], [place], [address] Phone: [phone]   Fax:  [phone]  Physical Therapy Treatment  Patient Details  Name: JERROD DAMIANO MRN: [id number] Date of Birth: 1960/11/19 Referring Provider (PT): Jodi Marble, MD   Encounter Date: 07/12/2019  PT End of Session - 07/12/19 0942    Visit Number  13    Number of Visits  18    Date for PT Re-Evaluation  07/19/19    Authorization Type  [employer] -[place] and Medicaid; 10th visit PN    Progress Note Due on Visit  20    PT Start Time  0805    PT Stop Time  0851    PT Time Calculation (min)  46 min    Activity Tolerance  Patient limited by fatigue    Behavior During Therapy  [facility] for tasks assessed/performed       Past Medical History:  Diagnosis Date  . ADHD (attention deficit hyperactivity disorder)   . Asthma   . Cardiomegaly   . Chronic back pain   . Chronic neck pain   . COPD (chronic obstructive pulmonary disease) ([place])   . Diabetes mellitus without complication ([place])   . Hypertension   . Liver failure ([place])   . Renal disorder   . Renal insufficiency   . Schizophrenia [facility])     Past Surgical History:  Procedure Laterality Date  . BACK SURGERY    . NECK SURGERY    . SPINAL FUSION      There were no vitals filed for this visit.  Subjective Assessment - 07/12/19 0808    Subjective  Pt still very fatigued.  Has a lot of doctor appointments coming up and trying to figure out when to fit them in.  Hasn't heard from oncology about setting up Iron infusion.    Pertinent History  : GI bleed, major depressive disorder, type 2 DM, ADHD, anemia, asthma, cardiomegaly, chronic back pain, chronic neck pain, COPD, HTN, liver failure, renal disorder, schizophrenia and multiple back/neck surgeries    Diagnostic tests  MRI    Patient Stated Goals  "help me not bring on the dizziness"    Currently in Pain?  Yes    Pain Onset  More than a month ago         [facility] PT Assessment - 07/12/19 0816      Assessment   Medical Diagnosis  vertigo    Referring Provider (PT)  Jodi Marble, MD    Onset Date/Surgical Date  12/08/18    Prior Therapy  yes      Precautions   Precautions  Other (comment)    Precaution Comments  : GI bleed, major depressive disorder, type 2 DM, ADHD, anemia, asthma, cardiomegaly, chronic back pain, chronic neck pain, COPD, HTN, liver failure, renal disorder, schizophrenia and multiple back/neck surgeries      Prior Function   Level of Independence  Independent    Vocation  Part time employment    Vocation Requirements  [employer] - in and out of vehicles, walking a lot      Observation/Other Assessments   Focus on Therapeutic Outcomes (FOTO)   N/A      Functional Gait  Assessment   Gait assessed   Yes    Gait Level Surface  Walks 20 ft, slow speed, abnormal gait pattern, evidence for imbalance or deviates 10-15 in outside of the 12 in walkway width. Requires more than 7 sec to  ambulate 20 ft.    Change in Gait Speed  Able to smoothly change walking speed without loss of balance or gait deviation. Deviate no more than 6 in outside of the 12 in walkway width.    Gait with Horizontal Head Turns  Performs head turns smoothly with no change in gait. Deviates no more than 6 in outside 12 in walkway width    Gait with Vertical Head Turns  Performs task with slight change in gait velocity (eg, minor disruption to smooth gait path), deviates 6 - 10 in outside 12 in walkway width or uses assistive device    Gait and Pivot Turn  Pivot turns safely within 3 sec and stops quickly with no loss of balance.    Step Over Obstacle  Is able to step over one shoe box (4.5 in total height) without changing gait speed. No evidence of imbalance.    Gait with Narrow Base of Support  Ambulates less than 4 steps heel to toe or cannot perform without assistance.    Gait with Eyes Closed  Walks 20 ft, slow speed, abnormal gait  pattern, evidence for imbalance, deviates 10-15 in outside 12 in walkway width. Requires more than 9 sec to ambulate 20 ft.    Ambulating Backwards  Walks 20 ft, uses assistive device, slower speed, mild gait deviations, deviates 6-10 in outside 12 in walkway width.    Steps  Alternating feet, must use rail.    Total Score  19    FGA comment:  19/30 mild dizziness with looking up>down.  Ambulating a little slower today due to fatigue          Vestibular Assessment - 07/12/19 0828      Visual Acuity   Static  8    Dynamic  5   3 line difference     Positional Sensitivities   Nose to Right Knee  Lightheadedness   in standing   Right Knee to Sitting  Lightedness   in standing   Nose to Left Knee  Lightheadedness   in standing   Left Knee to Sitting  Lightheadedness   in standing   Head Turning x 5  Mild dizziness    Head Nodding x 5  Lightheadedness    Pivot Right in Standing  Lightheadedness    Pivot Left in Standing  No dizziness    Positional Sensitivities Comments  Felt a little dizziness when speeding up gait 1/5;          Compensatory Strategies: Corrective Saccades    1. Perform in standing: TAPE TO THE WALL - DON'T HOLD -two stationary targets placed __12__ inches apart, move eyes to target on left, keep head still. Then move head to left, while eyes remain on target. 2. Repeat in opposite direction - eyes to the right and then head to the right. 3.  Also perform same exercise with two cards 12" apart, up and down Repeat sequence ___10_ times per session. Do __2__ sessions per day.    Turning in Place: Solid Surface - Two 90 deg turns    March in place slowly, while marching in place, do a 90 deg turn to the right plus one more (180 total) and then back to the front. Continue to march in place and do a 90 deg turn to the left, plus one more and then back to the front.  Repeat 2-3 times to each side. Do _2___ sessions per day. Progress to doing a full 360 turn  to left and then right but breaking it into 4 - 90 degree turns.    Gaze Stabilization: Standing Feet Apart    MOVE YOUR LETTER TO A PATTERN OR BUSY BACKGROUND.  Feet shoulder width apart, keeping eyes on target on wall __3__ feet away, tilt head down 15-30 and move head side to side for _60___ seconds. Repeat while moving head up and down for __60__ seconds. Do __2__ sessions per day.  TO progress this exercise - keep feet apart but stand on something soft (pillow or cushion).  Busy background  60 seconds each side to side and up and down.   Feet Together, Head Motion - Eyes Closed    With eyes closed and feet together, move head slowly, up and down 10 times.  Rest and then repeat moving head side to side 10 times. Repeat __1__ times per session. Do __2__ sessions per day.  To progress this exercise - stand with feet apart on soft surface - pillow or cushion.      PT Education - 07/12/19 0941    Education Details  reviewed results of goal check; will go on hold for 2 months to allow pt to receive iron infusion and then have pt return for reassessment to determine if pt needs further PT or if symptoms have improved with improving anemia.  Reviewed ongoing HEP recommendations for pt to perform during therapy hold    Person(s) Educated  Patient    Methods  Explanation;Demonstration;Handout    Comprehension  Verbalized understanding       PT Short Term Goals - 06/07/19 0847      PT SHORT TERM GOAL #1   Title  Pt will improve FGA to >/= 18/30 to indicate decreased falls risk    Baseline  15/30 > 20/30    Time  5    Period  Weeks    Status  Achieved    Target Date  06/14/19      PT SHORT TERM GOAL #2   Title  Pt will demonstrate independence with updated HEP    Time  5    Period  Weeks    Status  Achieved    Target Date  06/14/19        PT Long Term Goals - 07/12/19 0809      PT LONG TERM GOAL #1   Title  Pt will demonstrate independence with final vestibular and  balance HEP    Time  10    Period  Weeks    Status  On-going      PT LONG TERM GOAL #2   Title  Pt will demonstrate decreased falls risk during community gait as indicated by increase in FGA score to >/= 20/30    Time  10    Period  Weeks    Status  Not Met      PT LONG TERM GOAL #3   Title  Pt will demonstrate improved use of VOR as indicated by 2 line difference in DVA    Baseline  3 line difference (7, 4)    Time  10    Period  Weeks    Status  Not Met      PT LONG TERM GOAL #4   Title  Pt will report 0/5 dizziness with quick head turns/nods, quick body turns, reaching down to floor and with fast walking    Baseline  1-2/5 overall    Time  10    Period  Weeks  Status  Not Met      PT LONG TERM GOAL #5   Title  Pt will report ability to ride around at work and identify tags in cars with no more than 1/5 dizziness    Baseline  Still having to take Meclizine after riding around lot; reports 4-5/10 dizziness (moderate)    Time  10    Period  Weeks    Status  Partially Met            Plan - 07/12/19 0943    Clinical Impression Statement  Performed assessment of LTG.  Pt has remained fairly stable over the past few weeks and has not demonstrated any progress in DVA, balance during gait as indicated by FGA and motion sensitivity. Pt continues to be limited by fatigue which may be due to iron deficiency anemia (as diagnosed by pt's oncologist and bloodwork).  Pt does report a significant improvement in symptoms overall since starting therapy and has not had to leave work due to dizziness and has utilized techniques learned in therapy to decrease symptoms and improve balance at work.  Pt to be placed on hold to allow anemia to be addressed and then will have pt return to therapy in a couple months for re-assessment to determine if pt would benefit from further vestibular rehab. Pt agreeable to plan.  Provided pt with HEP to perform while on hold.    Personal Factors and  Comorbidities  Comorbidity 3+;Profession;Social Background    Comorbidities  : GI bleed, major depressive disorder, type 2 DM, ADHD, anemia, asthma, cardiomegaly, chronic back pain, chronic neck pain, COPD, HTN, liver failure, renal disorder, schizophrenia and multiple back/neck surgeries    Examination-Activity Limitations  Bend;Locomotion Level;Squat;Stand;Transfers    Examination-Participation Restrictions  Cleaning;Community Activity;Driving    Rehab Potential  Good    PT Frequency  1x / week    PT Duration  Other (comment)   10 weeks   PT Treatment/Interventions  ADLs/Self Care Home Management;Canalith Repostioning;Gait training;Functional mobility training;Therapeutic activities;Therapeutic exercise;Balance training;Neuromuscular re-education;Patient/family education;Passive range of motion;Vestibular;Cryotherapy;Moist Heat    PT Next Visit Plan  how is fatigue - did he get iron infusion?  reassess LTG and determine if pt would benefit from recert or D/C?    Consulted and Agree with Plan of Care  Patient       Patient will benefit from skilled therapeutic intervention in order to improve the following deficits and impairments:  Decreased balance, Decreased range of motion, Difficulty walking, Dizziness  Visit Diagnosis: Unsteadiness on feet  Dizziness and giddiness  Repeated falls  Difficulty in walking, not elsewhere classified     Problem List Patient Active Problem List   Diagnosis Date Noted  . Cellulitis of submandibular region 10/10/2015    Rico Junker, PT, DPT 07/12/19    9:48 AM    [facility] [address] [place], [place], [address] Phone: [phone]   Fax:  [phone]  Name: ANNETTE LIOTTA MRN: [id number] Date of Birth: 26-Feb-1960

## 2019-09-13 ENCOUNTER — Encounter: Payer: Self-pay | Admitting: Physical Therapy

## 2019-09-13 ENCOUNTER — Ambulatory Visit: Payer: Medicare Other | Attending: Otolaryngology | Admitting: Physical Therapy

## 2019-09-13 ENCOUNTER — Other Ambulatory Visit: Payer: Self-pay

## 2019-09-13 DIAGNOSIS — R262 Difficulty in walking, not elsewhere classified: Secondary | ICD-10-CM | POA: Insufficient documentation

## 2019-09-13 DIAGNOSIS — R2681 Unsteadiness on feet: Secondary | ICD-10-CM | POA: Diagnosis not present

## 2019-09-13 DIAGNOSIS — R296 Repeated falls: Secondary | ICD-10-CM | POA: Diagnosis present

## 2019-09-13 DIAGNOSIS — R42 Dizziness and giddiness: Secondary | ICD-10-CM | POA: Diagnosis present

## 2019-09-13 NOTE — Therapy (Signed)
[facility] [address] [place], [place], [address] Phone: [phone]   Fax:  [phone]  Physical Therapy Treatment  Patient Details  Name: Matthew Montes MRN: [id number] Date of Birth: [birth year] Referring Provider (PT): Jodi Marble, MD   Encounter Date: 09/13/2019   PT End of Session - 09/13/19 0847    Visit Number 14    Number of Visits 20    Date for PT Re-Evaluation 10/13/19    Authorization Type [employer] -[place] and Medicaid; 10th visit PN    Progress Note Due on Visit 20    PT Start Time 0803    PT Stop Time 0846    PT Time Calculation (min) 43 min    Activity Tolerance Patient tolerated treatment well    Behavior During Therapy [facility] for tasks assessed/performed           Past Medical History:  Diagnosis Date  . ADHD (attention deficit hyperactivity disorder)   . Asthma   . Cardiomegaly   . Chronic back pain   . Chronic neck pain   . COPD (chronic obstructive pulmonary disease) ([place])   . Diabetes mellitus without complication ([place])   . Hypertension   . Liver failure ([place])   . Renal disorder   . Renal insufficiency   . Schizophrenia [facility])     Past Surgical History:  Procedure Laterality Date  . BACK SURGERY    . NECK SURGERY    . SPINAL FUSION      There were no vitals filed for this visit.   Subjective Assessment - 09/13/19 0808    Subjective Still very fatigued; did not have iron infusions.  Has had a lot of doctor appointments in [facility] - has lost 10lb in 30 days without trying - doctors are keeping an eye on blood work and area on his lung.  Dizziness is still there; not as bad as when he first started therapy.  Had to get a refill on Meclizine.  Not taking it every day.  Had dizziness after driving to [facility], [place] and then working.    Pertinent History GI bleed, major depressive disorder, type 2 DM, ADHD, anemia, asthma, cardiomegaly, chronic back pain, chronic neck pain,  COPD, HTN, liver failure, renal disorder, schizophrenia and multiple back/neck surgeries    Diagnostic tests MRI    Patient Stated Goals "help me not bring on the dizziness"    Currently in Pain? Yes    Pain Score 5     Pain Location Back    Pain Orientation Lower    Pain Descriptors / Indicators Aching    Pain Type Chronic pain    Pain Onset More than a month ago              [facility] PT Assessment - 09/13/19 0815      Assessment   Medical Diagnosis vertigo    Referring Provider (PT) Jodi Marble, MD    Onset Date/Surgical Date 12/08/18    Prior Therapy yes      Precautions   Precautions Other (comment)    Precaution Comments GI bleed, major depressive disorder, type 2 DM, ADHD, anemia, asthma, cardiomegaly, chronic back pain, chronic neck pain, COPD, HTN, liver failure, renal disorder, schizophrenia and multiple back/neck surgeries      Prior Function   Level of Independence Independent    Vocation Part time employment    Vocation Requirements [employer] - in and out of vehicles, walking a lot  Observation/Other Assessments   Focus on Therapeutic Outcomes (FOTO)  N/A      Functional Gait  Assessment   Gait assessed  Yes    Gait Level Surface Walks 20 ft in less than 7 sec but greater than 5.5 sec, uses assistive device, slower speed, mild gait deviations, or deviates 6-10 in outside of the 12 in walkway width.    Change in Gait Speed Able to smoothly change walking speed without loss of balance or gait deviation. Deviate no more than 6 in outside of the 12 in walkway width.    Gait with Horizontal Head Turns Performs head turns smoothly with slight change in gait velocity (eg, minor disruption to smooth gait path), deviates 6-10 in outside 12 in walkway width, or uses an assistive device.    Gait with Vertical Head Turns Performs task with slight change in gait velocity (eg, minor disruption to smooth gait path), deviates 6 - 10 in outside 12 in walkway width  or uses assistive device    Gait and Pivot Turn Pivot turns safely within 3 sec and stops quickly with no loss of balance.    Step Over Obstacle Is able to step over 2 stacked shoe boxes taped together (9 in total height) without changing gait speed. No evidence of imbalance.    Gait with Narrow Base of Support Ambulates 4-7 steps.    Gait with Eyes Closed Walks 20 ft, slow speed, abnormal gait pattern, evidence for imbalance, deviates 10-15 in outside 12 in walkway width. Requires more than 9 sec to ambulate 20 ft.    Ambulating Backwards Walks 20 ft, uses assistive device, slower speed, mild gait deviations, deviates 6-10 in outside 12 in walkway width.    Steps Alternating feet, must use rail.    Total Score 21    FGA comment: 21/30 mild dizziness with head turns and nods.               Vestibular Assessment - 09/13/19 0828      Visual Acuity   Static 7    Dynamic 6      Positional Sensitivities   Nose to Right Knee No dizziness    Right Knee to Sitting No dizziness    Nose to Left Knee No dizziness    Left Knee to Sitting No dizziness   back pain reaching down to floor   Head Turning x 5 No dizziness    Head Nodding x 5 Lightheadedness    Pivot Right in Standing Lightheadedness    Pivot Left in Standing Mild dizziness                            PT Education - 09/13/19 0846    Education Details progress towards goals, plan for 3-4 more visits to work towards final goals, plan to D/C at the end of August    Person(s) Educated Patient    Methods Explanation    Comprehension Verbalized understanding            PT Short Term Goals - 09/13/19 1235      PT SHORT TERM GOAL #1   Title = LTG             PT Long Term Goals - 09/13/19 1235      PT LONG TERM GOAL #1   Title Pt will demonstrate independence with final vestibular and balance HEP    Time 4    Period Weeks  Status New    Target Date 10/13/19      PT LONG TERM GOAL #2   Title Pt  will demonstrate decreased falls risk during community gait as indicated by increase in FGA score to >/= 23/30    Baseline 21/30    Time 4    Period Weeks    Status New    Target Date 10/13/19      PT LONG TERM GOAL #4   Title Pt will report 0/5 dizziness with quick head turns/nods and quick body turns    Baseline 1-2/5 for head nods and turning    Time 4    Period Weeks    Status New    Target Date 10/13/19      PT LONG TERM GOAL #5   Title Pt will report ability to ride around at work and identify tags in cars with no more than 3/10 dizziness    Baseline 6/10 average    Time 4    Period Weeks    Status New    Target Date 10/13/19                 Plan - 09/13/19 1228    Clinical Impression Statement Pt returns after taking two month break to manage medical issues; performed re-assessment of LTG, progress and need for more visits.  During 2 month break pt did continue to perform HEP and utilize compensatory strategies; as a result pt was able to maintain gains and make progress.  Pt has met 2/5 LTG demonstrating decreased falls risk and improved balance during dynamic gait, improved gaze stability and reduced motion sensitivity.  Pt partially met MSQ as vertical head movements and quick turns of head and body continue to bring on symptoms.  HEP is ongoing and will be updated next session.  Due to progress pt will likely only require 3-4 more visits before ready for D/C.  Pt agreeable to plan.    Personal Factors and Comorbidities Comorbidity 3+;Profession;Social Background    Comorbidities : GI bleed, major depressive disorder, type 2 DM, ADHD, anemia, asthma, cardiomegaly, chronic back pain, chronic neck pain, COPD, HTN, liver failure, renal disorder, schizophrenia and multiple back/neck surgeries    Examination-Activity Limitations Bend;Locomotion Level;Squat;Stand;Transfers    Examination-Participation Restrictions Cleaning;Community Activity;Driving    Rehab Potential Good      PT Frequency 1x / week    PT Duration 4 weeks    PT Treatment/Interventions ADLs/Self Care Home Management;Canalith Repostioning;Gait training;Functional mobility training;Therapeutic activities;Therapeutic exercise;Balance training;Neuromuscular re-education;Patient/family education;Passive range of motion;Vestibular;Cryotherapy;Moist Heat    PT Next Visit Plan recerted for 4 more weeks.  update HEP; progress x1 and habituation.  Still getting dizzy with body turns and riding in [place] looking for numbers on car stickers.    Consulted and Agree with Plan of Care Patient           Patient will benefit from skilled therapeutic intervention in order to improve the following deficits and impairments:  Decreased balance, Decreased range of motion, Difficulty walking, Dizziness  Visit Diagnosis: Unsteadiness on feet  Dizziness and giddiness  Repeated falls  Difficulty in walking, not elsewhere classified     Problem List Patient Active Problem List   Diagnosis Date Noted  . Cellulitis of submandibular region 10/10/2015    Rico Junker, PT, DPT 09/13/19    12:38 PM    [place] [address] [place], [place], [address] Phone: [phone]   Fax:  [phone]  Name: Matthew Montes MRN: [id number] Date of Birth: 01-13-1961

## 2019-09-20 ENCOUNTER — Encounter: Payer: Self-pay | Admitting: Physical Therapy

## 2019-09-20 ENCOUNTER — Ambulatory Visit: Payer: Medicare Other | Attending: Otolaryngology | Admitting: Physical Therapy

## 2019-09-20 ENCOUNTER — Other Ambulatory Visit: Payer: Self-pay

## 2019-09-20 DIAGNOSIS — R262 Difficulty in walking, not elsewhere classified: Secondary | ICD-10-CM | POA: Diagnosis present

## 2019-09-20 DIAGNOSIS — R42 Dizziness and giddiness: Secondary | ICD-10-CM | POA: Insufficient documentation

## 2019-09-20 DIAGNOSIS — R2681 Unsteadiness on feet: Secondary | ICD-10-CM | POA: Insufficient documentation

## 2019-09-20 DIAGNOSIS — R296 Repeated falls: Secondary | ICD-10-CM | POA: Insufficient documentation

## 2019-09-20 NOTE — Patient Instructions (Addendum)
Gaze Stabilization: Standing Feet Apart (Compliant Surface)    With target on a patterned/busy background.  Feet apart on folded blanket, keeping eyes on target on wall __3__ feet away, tilt head down 15-30 and move head side to side for __60__ seconds. Repeat while moving head up and down for __60__ seconds. Do __2__ sessions per day.   Compensatory Strategies: Corrective Saccades    1. Perform in standing WITH FEET TOGETHER: TAPE TO THE WALL - DON'T HOLD -two stationary targets placed __12__ inches apart, move eyes to target on left, keep head still. Then move head to left, while eyes remain on target. 2. Repeat in opposite direction - eyes to the right and then head to the right. 3.  Also perform same exercise with two cards 12" apart, up and down Repeat sequence ___10_ times per session. Do __2__ sessions per day.    Turning in Place: Solid Surface - Two 90 deg turns    March in place slowly, while marching in place, do a 90 deg turn to the right plus one more (180 total) and then back to the front. Continue to march in place and do a 90 deg turn to the left, plus one more and then back to the front.  Repeat 2-3 times to each side. Do _2___ sessions per day. Progress to doing a full 360 turn to left and then right but breaking it into 4 - 90 degree turns.   Feet Together, Head Motion - Eyes Closed    With eyes closed and feet together, move head slowly, up and down 10 times.  Rest and then repeat moving head side to side 10 times. Repeat __1__ times per session. Do __2__ sessions per day.

## 2019-09-20 NOTE — Therapy (Addendum)
[facility] [address] [place], [place], [address] Phone: [phone]   Fax:  [phone]  Physical Therapy Treatment  Patient Details  Name: Matthew Montes MRN: [id number] Date of Birth: 1960-05-06 Referring Provider (PT): Flo Shanks, MD   Encounter Date: 09/20/2019   PT End of Session - 09/20/19 0850    Visit Number 15    Number of Visits 20    Date for PT Re-Evaluation 10/13/19    Authorization Type [employer] -[place] and Medicaid; 10th visit PN    Progress Note Due on Visit 20    PT Start Time 0807    PT Stop Time 0845    PT Time Calculation (min) 38 min    Activity Tolerance Patient tolerated treatment well    Behavior During Therapy [facility] for tasks assessed/performed           Past Medical History:  Diagnosis Date  . ADHD (attention deficit hyperactivity disorder)   . Asthma   . Cardiomegaly   . Chronic back pain   . Chronic neck pain   . COPD (chronic obstructive pulmonary disease) ([facility])   . Diabetes mellitus without complication ([facility])   . Hypertension   . Liver failure ([facility])   . Renal disorder   . Renal insufficiency   . Schizophrenia [facility])     Past Surgical History:  Procedure Laterality Date  . BACK SURGERY    . NECK SURGERY    . SPINAL FUSION      There were no vitals filed for this visit.   Subjective Assessment - 09/20/19 0812    Subjective Asked if therapy could do less than we did last session; after pt went home he felt very dizzy and had to take a meclizine; had to cancel appointment in [facility] because he didn't feel safe driving.  Went to see ENT who refilled Meclizine and recommended he continue with therapy.    Pertinent History GI bleed, major depressive disorder, type 2 DM, ADHD, anemia, asthma, cardiomegaly, chronic back pain, chronic neck pain, COPD, HTN, liver failure, renal disorder, schizophrenia and multiple back/neck surgeries    Diagnostic tests MRI    Patient Stated Goals  "help me not bring on the dizziness"    Currently in Pain? No/denies    Pain Onset More than a month ago                              Vestibular Treatment/Exercise - 09/20/19 0827      Vestibular Treatment/Exercise   Vestibular Treatment Provided Habituation;Gaze    Gaze Exercises Eye/Head Exercise Horizontal;Eye/Head Exercise Vertical;X1 Viewing Horizontal;X1 Viewing Vertical      X1 Viewing Horizontal   Foot Position feet apart, solid surface; feet apart, compliant surface    Reps 2    Comments 60 seconds - no symptoms on solid surface.  Reporting balance challenge on compliant surface but no increase in dizziness      X1 Viewing Vertical   Foot Position feet apart, solid surface; feet apart, compliant surface    Reps 2    Comments 60 seconds, mild dizziness on solid surface.  Mild symptoms with vertical head movements      Eye/Head Exercise Horizontal   Foot Position standing feet apart and then feet together    Reps 10    Comments compensatory saccades, target 3' in front of patient, taped >12" apart.  Mild dizziness  Eye/Head Exercise Vertical   Foot Position standing feet together    Reps 10    Comments compensatory saccades, target 3' in front of patient, taped >12" apart.  Mild dizziness              Balance Exercises - 09/20/19 0849      Balance Exercises: Standing   Standing Eyes Closed Narrow base of support (BOS);Solid surface   10 reps, still significant balance challenge            PT Education - 09/20/19 0850    Education Details updated HEP    Person(s) Educated Patient    Methods Explanation;Demonstration;Handout    Comprehension Verbalized understanding;Returned demonstration            PT Short Term Goals - 09/13/19 1235      PT SHORT TERM GOAL #1   Title = LTG             PT Long Term Goals - 09/13/19 1235      PT LONG TERM GOAL #1   Title Pt will demonstrate independence with final vestibular and  balance HEP    Time 4    Period Weeks    Status New    Target Date 10/13/19      PT LONG TERM GOAL #2   Title Pt will demonstrate decreased falls risk during community gait as indicated by increase in FGA score to >/= 23/30    Baseline 21/30    Time 4    Period Weeks    Status New    Target Date 10/13/19      PT LONG TERM GOAL #4   Title Pt will report 0/5 dizziness with quick head turns/nods and quick body turns    Baseline 1-2/5 for head nods and turning    Time 4    Period Weeks    Status New    Target Date 10/13/19      PT LONG TERM GOAL #5   Title Pt will report ability to ride around at work and identify tags in cars with no more than 3/10 dizziness    Baseline 6/10 average    Time 4    Period Weeks    Status New    Target Date 10/13/19                 Plan - 09/20/19 0946    Clinical Impression Statement Due to progress focused treatment session on progression of HEP by increasing balance challenge with narrow BOS or compliant surface while monitoring patient's symptoms to avoid over stimulation and causing delayed flare of symptoms.  Pt only reported mild symptoms throughout session.  Will continue to progress as pt is able to tolerate.    Personal Factors and Comorbidities Comorbidity 3+;Profession;Social Background    Comorbidities : GI bleed, major depressive disorder, type 2 DM, ADHD, anemia, asthma, cardiomegaly, chronic back pain, chronic neck pain, COPD, HTN, liver failure, renal disorder, schizophrenia and multiple back/neck surgeries    Examination-Activity Limitations Bend;Locomotion Level;Squat;Stand;Transfers    Examination-Participation Restrictions Cleaning;Community Activity;Driving    Rehab Potential Good    PT Frequency 1x / week    PT Duration 4 weeks    PT Treatment/Interventions ADLs/Self Care Home Management;Canalith Repostioning;Gait training;Functional mobility training;Therapeutic activities;Therapeutic exercise;Balance  training;Neuromuscular re-education;Patient/family education;Passive range of motion;Vestibular;Cryotherapy;Moist Heat    PT Next Visit Plan had to cancel 8/13 due to cardiology.  Progress turns and corner balance for HEP.  Still getting dizzy with body  turns and riding in [place] looking for numbers on car stickers.    Consulted and Agree with Plan of Care Patient           Patient will benefit from skilled therapeutic intervention in order to improve the following deficits and impairments:  Decreased balance, Decreased range of motion, Difficulty walking, Dizziness  Visit Diagnosis: Unsteadiness on feet  Dizziness and giddiness  Difficulty in walking, not elsewhere classified  Repeated falls     Problem List Patient Active Problem List   Diagnosis Date Noted  . Cellulitis of submandibular region 10/10/2015    Dierdre Highman, PT, DPT 09/20/19    9:49 AM    [facility] [address] [place], [place], [address] Phone: [phone]   Fax:  [phone]  Name: Matthew Montes MRN: [id number] Date of Birth: 10/21/1960

## 2019-09-27 ENCOUNTER — Encounter: Payer: Medicare Other | Admitting: Physical Therapy

## 2019-10-04 ENCOUNTER — Other Ambulatory Visit: Payer: Self-pay

## 2019-10-04 ENCOUNTER — Ambulatory Visit: Payer: Medicare Other | Admitting: Physical Therapy

## 2019-10-04 DIAGNOSIS — R2681 Unsteadiness on feet: Secondary | ICD-10-CM | POA: Diagnosis not present

## 2019-10-04 DIAGNOSIS — R296 Repeated falls: Secondary | ICD-10-CM

## 2019-10-04 DIAGNOSIS — R262 Difficulty in walking, not elsewhere classified: Secondary | ICD-10-CM

## 2019-10-04 DIAGNOSIS — R42 Dizziness and giddiness: Secondary | ICD-10-CM

## 2019-10-04 NOTE — Therapy (Signed)
[facility] [address] [place], [place], [address] Phone: [phone]   Fax:  [phone]  Physical Therapy Treatment  Patient Details  Name: Matthew Montes MRN: [id number] Date of Birth: 07/15/1960 Referring Provider (PT): Flo Shanks, MD   Encounter Date: 10/04/2019   PT End of Session - 10/04/19 0915    Visit Number 16    Number of Visits 20    Date for PT Re-Evaluation 10/13/19    Authorization Type UHC -[employer] and Medicaid; 10th visit PN    Progress Note Due on Visit 20    PT Start Time 0803    PT Stop Time 0850    PT Time Calculation (min) 47 min    Activity Tolerance Patient tolerated treatment well    Behavior During Therapy [facility] for tasks assessed/performed           Past Medical History:  Diagnosis Date  . ADHD (attention deficit hyperactivity disorder)   . Asthma   . Cardiomegaly   . Chronic back pain   . Chronic neck pain   . COPD (chronic obstructive pulmonary disease) (HCC)   . Diabetes mellitus without complication (HCC)   . Hypertension   . Liver failure (HCC)   . Renal disorder   . Renal insufficiency   . Schizophrenia [facility])     Past Surgical History:  Procedure Laterality Date  . BACK SURGERY    . NECK SURGERY    . SPINAL FUSION      There were no vitals filed for this visit.   Subjective Assessment - 10/04/19 0809    Subjective Had a lot of appointments in [facility] this past week - orthopedic would like to do THA but has been put on hold to investigate weight loss and breathing.  Referrals made for CT and to pulmonology.  Pt fatigued today, a little dizzy this week.    Pertinent History GI bleed, major depressive disorder, type 2 DM, ADHD, anemia, asthma, cardiomegaly, chronic back pain, chronic neck pain, COPD, HTN, liver failure, renal disorder, schizophrenia and multiple back/neck surgeries    Diagnostic tests MRI    Patient Stated Goals "help me not bring on the dizziness"     Currently in Pain? Yes    Pain Score 8     Pain Location Hip    Pain Onset More than a month ago                              Vestibular Treatment/Exercise - 10/04/19 0831      Vestibular Treatment/Exercise   Vestibular Treatment Provided Habituation;Gaze    Habituation Exercises Standing Horizontal Head Turns;Seated Vertical Head Turns;Seated Diagonal Head Turns    Gaze Exercises X1 Viewing Horizontal;X1 Viewing Vertical      Seated Horizontal Head Turns   Number of Reps  8    Symptom Description  rolling forwards and backwards in w/c to simulate job duties of riding in [place] and looking for numbers in cars - down and back in hallway making head turns to read numbers along hallway in various places.  Performed L and R forwards and backwards to L and R, then one lap just to L, one lap just to R      Seated Vertical Head Turns   Number of Reps  8    Symptom Description  rolling forwards and backwards in w/c to simulate job duties of riding in [place]  and looking for numbers in cars - down and back in hallway making head turns to read numbers along hallway in various places.  Performed L and R forwards and backwards to L and R, then one lap just to L, one lap just to R      Seated Diagonal Head Turns   Number of Reps 8    Symptoms Description  rolling forwards and backwards in w/c to simulate job duties of riding in [place] and looking for numbers in cars - down and back in hallway making head turns to read numbers along hallway in various places.  Performed L and R forwards and backwards to L and R, then one lap just to L, one lap just to R      X1 Viewing Horizontal   Foot Position feet apart, compliant surface    Reps 2    Comments 60 seconds self selected speed; 30 seconds with slightly faster speed.  At faster speed pt reporting increased dizziness, slight LOB backwards.  Pt stands in front of his bed at home.      X1 Viewing Vertical   Foot Position feet apart,  compliant surface    Reps 2    Comments 60 seconds self selected speed; 30 seconds with slightly faster speed.  At faster speed pt reporting increased dizziness, slight LOB backwards.  Pt stands in front of his bed at home.              Balance Exercises - 10/04/19 0905      Balance Exercises: Standing   Standing Eyes Closed Wide (BOA);Foam/compliant surface;Other reps (comment)   10 reps head turns/nods            PT Education - 10/04/19 0905    Education Details updated HEP from narrow BOS > wide BOS on compliant surface to decrease strain/pain in hip    Person(s) Educated Patient    Methods Explanation;Demonstration;Handout    Comprehension Verbalized understanding;Returned demonstration            PT Short Term Goals - 09/13/19 1235      PT SHORT TERM GOAL #1   Title = LTG             PT Long Term Goals - 09/13/19 1235      PT LONG TERM GOAL #1   Title Pt will demonstrate independence with final vestibular and balance HEP    Time 4    Period Weeks    Status New    Target Date 10/13/19      PT LONG TERM GOAL #2   Title Pt will demonstrate decreased falls risk during community gait as indicated by increase in FGA score to >/= 23/30    Baseline 21/30    Time 4    Period Weeks    Status New    Target Date 10/13/19      PT LONG TERM GOAL #4   Title Pt will report 0/5 dizziness with quick head turns/nods and quick body turns    Baseline 1-2/5 for head nods and turning    Time 4    Period Weeks    Status New    Target Date 10/13/19      PT LONG TERM GOAL #5   Title Pt will report ability to ride around at work and identify tags in cars with no more than 3/10 dizziness    Baseline 6/10 average    Time 4    Period Weeks  Status New    Target Date 10/13/19                 Plan - 10/04/19 0915    Clinical Impression Statement Pt continues to experience significant increase in hip pain limiting his ability to perform balance exercises with  feet together.  Adjusted and progressed HEP to remove narrow BOS but challenge balance with compliant surface.  Also continued to perform habituation to forward/backward movement with head turns in various directions to simulate work activities.  Will continue to address in order to progress towards LTG.    Personal Factors and Comorbidities Comorbidity 3+;Profession;Social Background    Comorbidities : GI bleed, major depressive disorder, type 2 DM, ADHD, anemia, asthma, cardiomegaly, chronic back pain, chronic neck pain, COPD, HTN, liver failure, renal disorder, schizophrenia and multiple back/neck surgeries    Examination-Activity Limitations Bend;Locomotion Level;Squat;Stand;Transfers    Examination-Participation Restrictions Cleaning;Community Activity;Driving    Rehab Potential Good    PT Frequency 1x / week    PT Duration 4 weeks    PT Treatment/Interventions ADLs/Self Care Home Management;Canalith Repostioning;Gait training;Functional mobility training;Therapeutic activities;Therapeutic exercise;Balance training;Neuromuscular re-education;Patient/family education;Passive range of motion;Vestibular;Cryotherapy;Moist Heat    PT Next Visit Plan Extend LTG x 1 week due to missed week.  Review and adjust turns.  Ride in power chair with head turns at different speeds in parking lot reading license plates    Consulted and Agree with Plan of Care Patient           Patient will benefit from skilled therapeutic intervention in order to improve the following deficits and impairments:  Decreased balance, Decreased range of motion, Difficulty walking, Dizziness  Visit Diagnosis: Unsteadiness on feet  Dizziness and giddiness  Difficulty in walking, not elsewhere classified  Repeated falls     Problem List Patient Active Problem List   Diagnosis Date Noted  . Cellulitis of submandibular region 10/10/2015    Dierdre Highman, PT, DPT 10/04/19    9:19 AM    [facility] [address] [place], [place], [address] Phone: [phone]   Fax:  [phone]  Name: RADIN RAPTIS MRN: [id number] Date of Birth: 06/04/1960

## 2019-10-04 NOTE — Patient Instructions (Signed)
Gaze Stabilization: Standing Feet Apart (Compliant Surface)    With target on a patterned/busy background.  Feet apart on folded blanket, keeping eyes on target on wall __3__ feet away, tilt head down 15-30 and move head side to side SLIGHTLY FASTER for __30__ seconds. Repeat while moving head up and down SLIGHTLY FASTER for __30__ seconds. Do __2__ sessions per day.  Compensatory Strategies: Corrective Saccades    1. Perform in standing WITH FEET APART ON THE FOLDED BLANKET: TAPE TO THE WALL - DON'T HOLD -two stationary targets placed __12__ inches apart, move eyes to target on left, keep head still. Then move head to left, while eyes remain on target. 2. Repeat in opposite direction - eyes to the right and then head to the right. 3.  Also perform same exercise with two cards 12" apart, up and down Repeat sequence ___10_ times per session. Do __2__ sessions per day.    Turning in Place: Solid Surface - Two 90 deg turns    March in place slowly, while marching in place, do a 90 deg turn to the right plus one more (180 total) and then back to the front. Continue to march in place and do a 90 deg turn to the left, plus one more and then back to the front.  Repeat 2-3 times to each side. Do _2___ sessions per day. Progress to doing a full 360 turn to left and then right but breaking it into 4 - 90 degree turns.   Feet Apart (Compliant Surface) Head Motion - Eyes Closed    Stand on compliant surface: FOLDED BLANKET with feet shoulder width apart. Close eyes and move head slowly, up and down 10 TIMES.  Repeat moving head side to side 10 times. Repeat 2 times per session. Do 2 sessions per day.

## 2019-10-11 ENCOUNTER — Encounter: Payer: Self-pay | Admitting: Physical Therapy

## 2019-10-11 ENCOUNTER — Ambulatory Visit: Payer: Medicare Other | Admitting: Physical Therapy

## 2019-10-11 ENCOUNTER — Other Ambulatory Visit: Payer: Self-pay

## 2019-10-11 DIAGNOSIS — R296 Repeated falls: Secondary | ICD-10-CM

## 2019-10-11 DIAGNOSIS — R2681 Unsteadiness on feet: Secondary | ICD-10-CM

## 2019-10-11 DIAGNOSIS — R42 Dizziness and giddiness: Secondary | ICD-10-CM

## 2019-10-11 DIAGNOSIS — R262 Difficulty in walking, not elsewhere classified: Secondary | ICD-10-CM

## 2019-10-11 NOTE — Therapy (Signed)
[facility] [address] [place], [place], [address] Phone: [phone]   Fax:  [phone]  Physical Therapy Treatment  Patient Details  Name: Matthew Montes MRN: [id number] Date of Birth: 1960/07/19 Referring Provider (PT): Flo Shanks, MD   Encounter Date: 10/11/2019   PT End of Session - 10/11/19 0848    Visit Number 17    Number of Visits 20    Date for PT Re-Evaluation 10/13/19    Authorization Type UHC -[facility] and Medicaid; 10th visit PN    Progress Note Due on Visit 20    PT Start Time 0803    PT Stop Time 0846    PT Time Calculation (min) 43 min    Activity Tolerance Patient tolerated treatment well    Behavior During Therapy [facility] for tasks assessed/performed           Past Medical History:  Diagnosis Date  . ADHD (attention deficit hyperactivity disorder)   . Asthma   . Cardiomegaly   . Chronic back pain   . Chronic neck pain   . COPD (chronic obstructive pulmonary disease) (HCC)   . Diabetes mellitus without complication (HCC)   . Hypertension   . Liver failure (HCC)   . Renal disorder   . Renal insufficiency   . Schizophrenia [facility])     Past Surgical History:  Procedure Laterality Date  . BACK SURGERY    . NECK SURGERY    . SPINAL FUSION      There were no vitals filed for this visit.   Subjective Assessment - 10/11/19 0805    Subjective Still dealing with fatigue and having a hard time getting going in the morning.  Had to take medication a couple times this week.  Feels therapy is helping but also gets dizziness stirred up.    Pertinent History GI bleed, major depressive disorder, type 2 DM, ADHD, anemia, asthma, cardiomegaly, chronic back pain, chronic neck pain, COPD, HTN, liver failure, renal disorder, schizophrenia and multiple back/neck surgeries    Diagnostic tests MRI    Patient Stated Goals "help me not bring on the dizziness"    Currently in Pain? Yes    Pain Onset More than a  month ago                                  Balance Exercises - 10/11/19 0824      Balance Exercises: Standing   Standing Eyes Closed Wide (BOA)    Wall Bumps Hip;Eyes opened;Eyes closed;5 reps   feet apart, solid surface, chair in front, supervision   Gait with Head Turns Forward;5 reps;Limitations    Gait with Head Turns Limitations walking forwards with head turn and then body turn to same side x 90 deg x 2 to make full 180 turn and continue walking forwards.  Performed x 5 reps to L and R with verbal cues to sequence.    Turning Right;Left;3 reps;Limitations    Turning Limitations 90 deg turns while marching in place; focused on turning head first for gaze stability before turning body.             PT Education - 10/11/19 0845    Education Details removed eyes closed from  HEP and revised turns    Person(s) Educated Patient    Methods Explanation;Demonstration;Handout    Comprehension Verbalized understanding;Returned demonstration         Gaze  Stabilization: Standing Feet Apart (Compliant Surface)    With target on a patterned/busy background.  Feet apart on folded blanket, keeping eyes on target on wall __3__ feet away, tilt head down 15-30 and move head side to side SLIGHTLY FASTER for __30__ seconds. Repeat while moving head up and down SLIGHTLY FASTER for __30__ seconds. Do __2__ sessions per day.  Compensatory Strategies: Corrective Saccades    1. Perform in standing WITH FEET APART ON THE FOLDED BLANKET: TAPE TO THE WALL - DON'T HOLD -two stationary targets placed __12__ inches apart, move eyes to target on left, keep head still. Then move head to left, while eyes remain on target. 2. Repeat in opposite direction - eyes to the right and then head to the right. 3.  Also perform same exercise with two cards 12" apart, up and down Repeat sequence ___10_ times per session. Do __2__ sessions per day.    Turning in Place: Solid Surface -  Marching in place - 90 deg turns    March in place slowly, while marching in place, turn your head first to the right and then body to the right.   Keeping marching in place and turn your head back to the middle then body. Repeat head turn to left, body to left while marching and then head back to the middle then body back to middle.  Rest. Perform two more times to each side.     PT Short Term Goals - 09/13/19 1235      PT SHORT TERM GOAL #1   Title = LTG             PT Long Term Goals - 09/13/19 1235      PT LONG TERM GOAL #1   Title Pt will demonstrate independence with final vestibular and balance HEP    Time 4    Period Weeks    Status New    Target Date 10/13/19      PT LONG TERM GOAL #2   Title Pt will demonstrate decreased falls risk during community gait as indicated by increase in FGA score to >/= 23/30    Baseline 21/30    Time 4    Period Weeks    Status New    Target Date 10/13/19      PT LONG TERM GOAL #4   Title Pt will report 0/5 dizziness with quick head turns/nods and quick body turns    Baseline 1-2/5 for head nods and turning    Time 4    Period Weeks    Status New    Target Date 10/13/19      PT LONG TERM GOAL #5   Title Pt will report ability to ride around at work and identify tags in cars with no more than 3/10 dizziness    Baseline 6/10 average    Time 4    Period Weeks    Status New    Target Date 10/13/19                 Plan - 10/11/19 0846    Clinical Impression Statement Pt's hip pain better today but did not progress exercises to feet together.  Pt continues to report significant increase in symptoms when performing exercises with work with household chores; pt reporting greatest symptoms with eyes closed.  Adjusted and revised HEP to continue to address impairments but keeping symptoms at a mild level.  Will continue to perform balance training and balance reaction training with eyes  closed in therapy sessions.  Will also  continue to train pt to use spotting when performing head and body turns repeatedly to keep symptoms mild.    Personal Factors and Comorbidities Comorbidity 3+;Profession;Social Background    Comorbidities : GI bleed, major depressive disorder, type 2 DM, ADHD, anemia, asthma, cardiomegaly, chronic back pain, chronic neck pain, COPD, HTN, liver failure, renal disorder, schizophrenia and multiple back/neck surgeries    Examination-Activity Limitations Bend;Locomotion Level;Squat;Stand;Transfers    Examination-Participation Restrictions Cleaning;Community Activity;Driving    Rehab Potential Good    PT Frequency 1x / week    PT Duration 4 weeks    PT Treatment/Interventions ADLs/Self Care Home Management;Canalith Repostioning;Gait training;Functional mobility training;Therapeutic activities;Therapeutic exercise;Balance training;Neuromuscular re-education;Patient/family education;Passive range of motion;Vestibular;Cryotherapy;Moist Heat    PT Next Visit Plan Check goals; simulate making coffee turning head and then body L and R between two cabinets. Ride in power chair with head turns at different speeds in parking lot reading license plates    Consulted and Agree with Plan of Care Patient           Patient will benefit from skilled therapeutic intervention in order to improve the following deficits and impairments:  Decreased balance, Decreased range of motion, Difficulty walking, Dizziness  Visit Diagnosis: Unsteadiness on feet  Dizziness and giddiness  Difficulty in walking, not elsewhere classified  Repeated falls     Problem List Patient Active Problem List   Diagnosis Date Noted  . Cellulitis of submandibular region 10/10/2015    Dierdre Highman, PT, DPT 10/11/19    9:47 AM    [facility] [address] [place], [place], [address] Phone: [phone]   Fax:  [phone]  Name: KYRAN CONNAUGHTON MRN:  [id number] Date of Birth: 1960/03/21

## 2019-10-11 NOTE — Patient Instructions (Signed)
Gaze Stabilization: Standing Feet Apart (Compliant Surface)    With target on a patterned/busy background.  Feet apart on folded blanket, keeping eyes on target on wall __3__ feet away, tilt head down 15-30 and move head side to side SLIGHTLY FASTER for __30__ seconds. Repeat while moving head up and down SLIGHTLY FASTER for __30__ seconds. Do __2__ sessions per day.  Compensatory Strategies: Corrective Saccades    1. Perform in standing WITH FEET APART ON THE FOLDED BLANKET: TAPE TO THE WALL - DON'T HOLD -two stationary targets placed __12__ inches apart, move eyes to target on left, keep head still. Then move head to left, while eyes remain on target. 2. Repeat in opposite direction - eyes to the right and then head to the right. 3.  Also perform same exercise with two cards 12" apart, up and down Repeat sequence ___10_ times per session. Do __2__ sessions per day.    Turning in Place: Solid Surface - Marching in place - 90 deg turns    March in place slowly, while marching in place, turn your head first to the right and then body to the right.   Keeping marching in place and turn your head back to the middle then body. Repeat head turn to left, body to left while marching and then head back to the middle then body back to middle.  Rest. Perform two more times to each side.

## 2019-10-18 ENCOUNTER — Encounter: Payer: Self-pay | Admitting: Physical Therapy

## 2019-10-18 ENCOUNTER — Ambulatory Visit: Payer: Medicare Other | Attending: Otolaryngology | Admitting: Physical Therapy

## 2019-10-18 ENCOUNTER — Other Ambulatory Visit: Payer: Self-pay

## 2019-10-18 DIAGNOSIS — R296 Repeated falls: Secondary | ICD-10-CM | POA: Diagnosis present

## 2019-10-18 DIAGNOSIS — R262 Difficulty in walking, not elsewhere classified: Secondary | ICD-10-CM | POA: Diagnosis present

## 2019-10-18 DIAGNOSIS — R42 Dizziness and giddiness: Secondary | ICD-10-CM

## 2019-10-18 DIAGNOSIS — R2681 Unsteadiness on feet: Secondary | ICD-10-CM

## 2019-10-18 NOTE — Therapy (Signed)
[facility] [address] [place], [place], [address] Phone: [phone]   Fax:  [phone]  Physical Therapy Treatment  Patient Details  Name: Matthew Montes MRN: [id number] Date of Birth: [birth year] Referring Provider (PT): Jodi Marble, MD   Encounter Date: 10/18/2019   PT End of Session - 10/18/19 0843    Visit Number 18    Number of Visits 20    Date for PT Re-Evaluation 11/01/19    Authorization Type [place] and Medicaid; 10th visit PN    Progress Note Due on Visit 20    PT Start Time 0812    PT Stop Time 0845    PT Time Calculation (min) 33 min    Activity Tolerance Patient tolerated treatment well    Behavior During Therapy [facility] for tasks assessed/performed           Past Medical History:  Diagnosis Date  . ADHD (attention deficit hyperactivity disorder)   . Asthma   . Cardiomegaly   . Chronic back pain   . Chronic neck pain   . COPD (chronic obstructive pulmonary disease) ([place])   . Diabetes mellitus without complication ([place])   . Hypertension   . Liver failure ([place])   . Renal disorder   . Renal insufficiency   . Schizophrenia [facility])     Past Surgical History:  Procedure Laterality Date  . BACK SURGERY    . NECK SURGERY    . SPINAL FUSION      There were no vitals filed for this visit.   Subjective Assessment - 10/18/19 0814    Subjective Pt late this morning; has to go to [facility] today for CT scan.   Dizziness is not bad today.    Pertinent History GI bleed, major depressive disorder, type 2 DM, ADHD, anemia, asthma, cardiomegaly, chronic back pain, chronic neck pain, COPD, HTN, liver failure, renal disorder, schizophrenia and multiple back/neck surgeries    Diagnostic tests MRI    Patient Stated Goals "help me not bring on the dizziness"    Currently in Pain? Yes    Pain Score 5     Pain Location Hip    Pain Onset More than a month ago              [facility] PT Assessment -  10/18/19 0819      Assessment   Medical Diagnosis vertigo    Referring Provider (PT) Jodi Marble, MD    Onset Date/Surgical Date 12/08/18    Prior Therapy yes      Precautions   Precautions Other (comment)    Precaution Comments GI bleed, major depressive disorder, type 2 DM, ADHD, anemia, asthma, cardiomegaly, chronic back pain, chronic neck pain, COPD, HTN, liver failure, renal disorder, schizophrenia and multiple back/neck surgeries      Prior Function   Level of Independence Independent      Observation/Other Assessments   Focus on Therapeutic Outcomes (FOTO)  N/A      Functional Gait  Assessment   Gait assessed  Yes    Gait Level Surface Walks 20 ft in less than 7 sec but greater than 5.5 sec, uses assistive device, slower speed, mild gait deviations, or deviates 6-10 in outside of the 12 in walkway width.    Change in Gait Speed Able to smoothly change walking speed without loss of balance or gait deviation. Deviate no more than 6 in outside of the 12 in walkway width.    Gait with  Horizontal Head Turns Performs head turns smoothly with no change in gait. Deviates no more than 6 in outside 12 in walkway width    Gait with Vertical Head Turns Performs task with slight change in gait velocity (eg, minor disruption to smooth gait path), deviates 6 - 10 in outside 12 in walkway width or uses assistive device    Gait and Pivot Turn Pivot turns safely within 3 sec and stops quickly with no loss of balance.    Step Over Obstacle Is able to step over 2 stacked shoe boxes taped together (9 in total height) without changing gait speed. No evidence of imbalance.    Gait with Narrow Base of Support Ambulates less than 4 steps heel to toe or cannot perform without assistance.    Gait with Eyes Closed Walks 20 ft, slow speed, abnormal gait pattern, evidence for imbalance, deviates 10-15 in outside 12 in walkway width. Requires more than 9 sec to ambulate 20 ft.    Ambulating Backwards Walks 20  ft, uses assistive device, slower speed, mild gait deviations, deviates 6-10 in outside 12 in walkway width.    Steps Alternating feet, must use rail.    Total Score 21    FGA comment: 21/30 mild dizziness with vertical head movements               Vestibular Assessment - 10/18/19 0829      Positional Sensitivities   Nose to Right Knee No dizziness    Right Knee to Sitting No dizziness    Nose to Left Knee No dizziness    Left Knee to Sitting No dizziness    Head Turning x 5 Lightheadedness    Head Nodding x 5 Lightheadedness    Pivot Right in Standing Lightheadedness    Pivot Left in Standing Lightheadedness                     Vestibular Treatment/Exercise - 10/18/19 0836      Vestibular Treatment/Exercise   Vestibular Treatment Provided Habituation    Habituation Exercises Standing Horizontal Head Turns      Seated Horizontal Head Turns   Number of Reps  3    Symptom Description  with 90 deg turns x 2 to L and R taking object from tall surface <> low surface to simulate making coffee in kitchen in the morning.  Mild dizziness but not over threshold                 PT Education - 10/18/19 0843    Education Details progress made, plan for final session next week    Person(s) Educated Patient    Methods Explanation    Comprehension Verbalized understanding            PT Short Term Goals - 09/13/19 1235      PT SHORT TERM GOAL #1   Title = LTG             PT Long Term Goals - 10/18/19 0950      PT LONG TERM GOAL #1   Title Pt will demonstrate independence with final vestibular and balance HEP    Time 2    Period Weeks    Status On-going    Target Date 11/01/19      PT LONG TERM GOAL #2   Title Pt will demonstrate decreased falls risk during community gait as indicated by increase in FGA score to >/= 23/30    Baseline 21/30 -  remained stable at 14/30    Status Not Met      PT LONG TERM GOAL #4   Title Pt will report 0/5  dizziness with quick head turns/nods and quick body turns    Baseline 1/5 for head nods, turns and pivoting    Status Partially Met      PT LONG TERM GOAL #5   Title Pt will report ability to ride around at work and identify tags in cars with no more than 3/10 dizziness    Baseline 6/10 average    Time 2    Period Weeks    Status On-going    Target Date 11/01/19                 Plan - 10/18/19 0845    Clinical Impression Statement Initiated assessment of progress towards LTG and readiness to D/C.  Pt partially met motion sensitivity goal; demonstrates decreased motion sensitivity intensity overall but continues to feel very mild symptoms with vertical and horizontal head movements and pivoting/turning to L and R.  Pt did not meet FGA goal as his score stayed stable at 21/30; pt continues to demonstrate difficulty and imbalance when vision removed, slower gait speeds, and inability to perform gait with narrow BOS.  Pt will benefit from one more visit to review final HEP recommendations to continue to address motion sensitivity and balance impairments.    Personal Factors and Comorbidities Comorbidity 3+;Profession;Social Background    Comorbidities : GI bleed, major depressive disorder, type 2 DM, ADHD, anemia, asthma, cardiomegaly, chronic back pain, chronic neck pain, COPD, HTN, liver failure, renal disorder, schizophrenia and multiple back/neck surgeries    Examination-Activity Limitations Bend;Locomotion Level;Squat;Stand;Transfers    Examination-Participation Restrictions Cleaning;Community Activity;Driving    Rehab Potential Good    PT Frequency 1x / week    PT Duration 2 weeks    PT Treatment/Interventions ADLs/Self Care Home Management;Canalith Repostioning;Gait training;Functional mobility training;Therapeutic activities;Therapeutic exercise;Balance training;Neuromuscular re-education;Patient/family education;Passive range of motion;Vestibular;Cryotherapy;Moist Heat    PT Next  Visit Plan finalize HEP, turns with spotting    Consulted and Agree with Plan of Care Patient           Patient will benefit from skilled therapeutic intervention in order to improve the following deficits and impairments:  Decreased balance, Decreased range of motion, Difficulty walking, Dizziness  Visit Diagnosis: Unsteadiness on feet  Dizziness and giddiness  Difficulty in walking, not elsewhere classified  Repeated falls     Problem List Patient Active Problem List   Diagnosis Date Noted  . Cellulitis of submandibular region 10/10/2015   Rico Junker, PT, DPT 10/18/19    9:54 AM    [facility] [address] [place], [place], [address] Phone: [phone]   Fax:  [phone]  Name: Matthew Montes MRN: [id number] Date of Birth: March 16, 1960

## 2019-10-25 ENCOUNTER — Encounter: Payer: Medicare Other | Admitting: Physical Therapy

## 2019-11-01 ENCOUNTER — Other Ambulatory Visit: Payer: Self-pay

## 2019-11-01 ENCOUNTER — Ambulatory Visit: Payer: Medicare Other | Admitting: Physical Therapy

## 2019-11-01 ENCOUNTER — Encounter: Payer: Self-pay | Admitting: Physical Therapy

## 2019-11-01 VITALS — BP 130/88 | HR 72

## 2019-11-01 DIAGNOSIS — R42 Dizziness and giddiness: Secondary | ICD-10-CM

## 2019-11-01 DIAGNOSIS — R262 Difficulty in walking, not elsewhere classified: Secondary | ICD-10-CM

## 2019-11-01 DIAGNOSIS — R2681 Unsteadiness on feet: Secondary | ICD-10-CM | POA: Diagnosis not present

## 2019-11-01 DIAGNOSIS — R296 Repeated falls: Secondary | ICD-10-CM

## 2019-11-01 NOTE — Patient Instructions (Signed)
Gaze Stabilization: Standing Feet Apart (Compliant Surface)    With target on a patterned/busy background.  Feet apart on folded blanket, keeping eyes on target on wall __3__ feet away, tilt head down 15-30 and move head side to side SLIGHTLY FASTER for __30__ seconds. Repeat while moving head up and down SLIGHTLY FASTER for __30__ seconds.   Every week try to add 5 seconds to your time.  Try to work up to 60 seconds total. Do __2__ sessions per day.  Compensatory Strategies: Corrective Saccades    1. Perform in standing WITH FEET APART ON THE FOLDED BLANKET: TAPE TO THE WALL - DON'T HOLD -two stationary targets placed __12__ inches apart, move eyes to target on left, keep head still. Then move head to left, while eyes remain on target. 2. Repeat in opposite direction - eyes to the right and then head to the right. 3.  Also perform same exercise with two cards 12" apart, up and down Repeat sequence ___10_ times per session. Do __2__ sessions per day.  Every week try to add 2 more repetitions.  Work up to doing 20 repetitions each direction.    Turning in Place: Solid Surface - Marching in place - 90 deg turns    March in place slowly, while marching in place, turn your head first to the right and then body to the right.   Keeping marching in place and turn your head back to the middle then body. Repeat head turn to left, body to left while marching and then head back to the middle then body back to middle.  Rest. Perform two more times to each side.

## 2019-11-01 NOTE — Therapy (Signed)
[facility] [address] [place], [place], [address] Phone: [phone]   Fax:  [phone]  Physical Therapy Treatment and D/C Summary  Patient Details  Name: Matthew Montes MRN: [id number] Date of Birth: 25-Jun-1960 Referring Provider (PT): Jodi Marble, MD   Encounter Date: 11/01/2019   PT End of Session - 11/01/19 0841    Visit Number 19    Number of Visits 20    Date for PT Re-Evaluation 11/01/19    Authorization Type [employer] and Medicaid; 10th visit PN    Progress Note Due on Visit 20    PT Start Time 0805    PT Stop Time 0843    PT Time Calculation (min) 38 min    Activity Tolerance Patient limited by pain    Behavior During Therapy [facility] for tasks assessed/performed           Past Medical History:  Diagnosis Date  . ADHD (attention deficit hyperactivity disorder)   . Asthma   . Cardiomegaly   . Chronic back pain   . Chronic neck pain   . COPD (chronic obstructive pulmonary disease) ([place])   . Diabetes mellitus without complication ([place])   . Hypertension   . Liver failure ([place])   . Renal disorder   . Renal insufficiency   . Schizophrenia [facility])     Past Surgical History:  Procedure Laterality Date  . BACK SURGERY    . NECK SURGERY    . SPINAL FUSION      Vitals:   11/01/19 0808  BP: 130/88  Pulse: 72  SpO2: 98%     Subjective Assessment - 11/01/19 0806    Subjective [place] the auction, slipped on waxed floor, had wet shoes on.  Caught self on RUE, thinks he bruised ribs.  Did not happen due to dizziness.  "Will you check my BP?"  Has not heard results of CT of abdomen.    Pertinent History GI bleed, major depressive disorder, type 2 DM, ADHD, anemia, asthma, cardiomegaly, chronic back pain, chronic neck pain, COPD, HTN, liver failure, renal disorder, schizophrenia and multiple back/neck surgeries    Diagnostic tests MRI    Patient Stated Goals "help me not bring on the dizziness"     Currently in Pain? Yes    Pain Score 8     Pain Location Other (Comment)   RIBS   Pain Orientation Right    Pain Descriptors / Indicators Sore;Aching    Pain Type Acute pain    Pain Onset More than a month ago                   Vestibular Assessment - 11/01/19 0816      Positional Sensitivities   Head Turning x 5 No dizziness    Head Nodding x 5 Lightheadedness    Pivot Right in Standing Lightheadedness    Pivot Left in Standing Lightheadedness                     Vestibular Treatment/Exercise - 11/01/19 0828      Vestibular Treatment/Exercise   Vestibular Treatment Provided Gaze    Gaze Exercises X1 Viewing Horizontal;X1 Viewing Vertical      X1 Viewing Horizontal   Foot Position feet apart, compliant surface    Reps 2    Comments 30 seconds at self selected and faster speed, mild dizziness.  No change at this time      X1 Viewing Vertical  Foot Position feet apart, compliant surface    Reps 2    Comments 30 seconds at self selected and faster speed, mild dizziness.  No change at this time      Eye/Head Exercise Horizontal   Foot Position feet apart on compliant surface    Reps 10    Comments mild dizziness; compensatory saccades      Eye/Head Exercise Vertical   Foot Position feet apart on compliant surface    Reps 10    Comments mild dizziness; compensatory saccades                 PT Education - 11/01/19 0841    Education Details Final HEP, D/C today    Person(s) Educated Patient    Methods Explanation;Demonstration;Handout    Comprehension Verbalized understanding;Returned demonstration          Gaze Stabilization: Standing Feet Apart (Compliant Surface)    With target on a patterned/busy background.  Feet apart on folded blanket, keeping eyes on target on wall __3__ feet away, tilt head down 15-30 and move head side to side SLIGHTLY FASTER for __30__ seconds. Repeat while moving head up and down SLIGHTLY FASTER for __30__  seconds.   Every week try to add 5 seconds to your time.  Try to work up to 60 seconds total. Do __2__ sessions per day.  Compensatory Strategies: Corrective Saccades    1. Perform in standing WITH FEET APART ON THE FOLDED BLANKET: TAPE TO THE WALL - DON'T HOLD -two stationary targets placed __12__ inches apart, move eyes to target on left, keep head still. Then move head to left, while eyes remain on target. 2. Repeat in opposite direction - eyes to the right and then head to the right. 3.  Also perform same exercise with two cards 12" apart, up and down Repeat sequence ___10_ times per session. Do __2__ sessions per day.  Every week try to add 2 more repetitions.  Work up to doing 20 repetitions each direction.    Turning in Place: Solid Surface - Marching in place - 90 deg turns    March in place slowly, while marching in place, turn your head first to the right and then body to the right.   Keeping marching in place and turn your head back to the middle then body. Repeat head turn to left, body to left while marching and then head back to the middle then body back to middle.  Rest. Perform two more times to each side.     PT Short Term Goals - 09/13/19 1235      PT SHORT TERM GOAL #1   Title = LTG             PT Long Term Goals - 11/01/19 1610      PT LONG TERM GOAL #1   Title Pt will demonstrate independence with final vestibular and balance HEP    Time 2    Period Weeks    Status Achieved      PT LONG TERM GOAL #2   Title Pt will demonstrate decreased falls risk during community gait as indicated by increase in FGA score to >/= 23/30    Baseline 21/30 - remained stable at 21/30    Status Not Met      PT LONG TERM GOAL #4   Title Pt will report 0/5 dizziness with quick head turns/nods and quick body turns    Baseline 1/5 for head nods, turns and pivoting  Status Partially Met      PT LONG TERM GOAL #5   Title Pt will report ability to ride around at work and  identify tags in cars with no more than 3/10 dizziness    Baseline Reports it is better using compensatory techniques, reports <5/10    Time 2    Period Weeks    Status Achieved                 Plan - 11/01/19 0953    Clinical Impression Statement Pt has made steady progress and has met 2/4 LTG.  Pt is independent with HEP and will continue to self progress HEP at home and pt reports decrease intensity in dizziness when performing head turns riding in [place] at work.  Pt is more independent with using adaptive and compensatory strategies to reduce symptoms at home and at work.  Pt's balance has improved overall but pt did not mett FGA goal, score has remained stable over the past few weeks.  Pt continues to experience mild motion sensitivity overall with head and body turns but sensitivity has significantly improved from initial eval.  Pt is safe and ready for D/C.    Personal Factors and Comorbidities Comorbidity 3+;Profession;Social Background    Comorbidities : GI bleed, major depressive disorder, type 2 DM, ADHD, anemia, asthma, cardiomegaly, chronic back pain, chronic neck pain, COPD, HTN, liver failure, renal disorder, schizophrenia and multiple back/neck surgeries    Examination-Activity Limitations Bend;Locomotion Level;Squat;Stand;Transfers    Examination-Participation Restrictions Cleaning;Community Activity;Driving    Rehab Potential Good    PT Frequency 1x / week    PT Duration 2 weeks    PT Treatment/Interventions ADLs/Self Care Home Management;Canalith Repostioning;Gait training;Functional mobility training;Therapeutic activities;Therapeutic exercise;Balance training;Neuromuscular re-education;Patient/family education;Passive range of motion;Vestibular;Cryotherapy;Moist Heat    Consulted and Agree with Plan of Care Patient           Patient will benefit from skilled therapeutic intervention in order to improve the following deficits and impairments:  Decreased balance,  Decreased range of motion, Difficulty walking, Dizziness  Visit Diagnosis: Unsteadiness on feet  Dizziness and giddiness  Difficulty in walking, not elsewhere classified  Repeated falls     Problem List Patient Active Problem List   Diagnosis Date Noted  . Cellulitis of submandibular region 10/10/2015    PHYSICAL THERAPY DISCHARGE SUMMARY  Visits from Start of Care: 19  Current functional level related to goals / functional outcomes: See LTG achievement and impression statement above.     Remaining deficits: Mild motion sensitivity, mild imbalance   Education / Equipment: HEP  Plan: Patient agrees to discharge.  Patient goals were partially met. Patient is being discharged due to being pleased with the current functional level.  ?????     Rico Junker, PT, DPT 11/01/19    9:56 AM    [facility] [address] [place], [place], [address] Phone: [phone]   Fax:  [phone]  Name: DEMARIE HYNEMAN MRN: [id number] Date of Birth: 1960-07-17

## 2019-11-08 ENCOUNTER — Encounter: Payer: Medicare Other | Admitting: Physical Therapy

## 2020-09-03 ENCOUNTER — Encounter (HOSPITAL_COMMUNITY): Payer: Self-pay | Admitting: *Deleted

## 2020-09-03 NOTE — Progress Notes (Signed)
Received referral from Dr. Rolm Baptise at [facility] for this pt to participate in pulmonary rehab with the the diagnosis of dyspnea on exertion. Clinical review of  progress notes in CareEverywhere. Pt with Covid Risk Score - 3. Pt appropriate for scheduling for Pulmonary rehab if able to tolerated due to back pain.  Pt is followed by pain clinic for management.   Will forward to support staff for scheduling and verification of insurance eligibility/benefits with pt consent. Alanson Aly, BSN Cardiac and Emergency planning/management officer

## 2020-10-20 ENCOUNTER — Telehealth (HOSPITAL_COMMUNITY): Payer: Self-pay

## 2020-10-20 ENCOUNTER — Encounter (HOSPITAL_COMMUNITY): Payer: Self-pay

## 2020-10-20 NOTE — Telephone Encounter (Signed)
Attempted to contact pt in regards to Pulmonary Rehab. LM on VM  Mailed letter 

## 2020-10-20 NOTE — Telephone Encounter (Signed)
Pt insurance is active and benefits verified through [facility] Medicare. Co-pay $0.00, DED $0.00/$0.00 met, out of pocket $0.00/$0.00 met, co-insurance 0%. No pre-authorization required. [place]/[employer] Medicare, 10/20/20 @ 3:54PM, RXJ#[id number]   Will contact patient to see if he is interested in the Pulmonary Rehab Program.

## 2020-11-04 ENCOUNTER — Telehealth (HOSPITAL_COMMUNITY): Payer: Self-pay

## 2020-11-04 NOTE — Telephone Encounter (Signed)
No repsonse from pt.  Closed referral  

## 2021-08-13 ENCOUNTER — Emergency Department (HOSPITAL_COMMUNITY)
Admission: EM | Admit: 2021-08-13 | Discharge: 2021-08-13 | Disposition: A | Discharge status: Home / Self Care | Payer: Medicare Other | Attending: Emergency Medicine | Admitting: Emergency Medicine

## 2021-08-13 ENCOUNTER — Encounter (HOSPITAL_COMMUNITY): Payer: Self-pay

## 2021-08-13 ENCOUNTER — Emergency Department (HOSPITAL_COMMUNITY): Payer: Medicare Other

## 2021-08-13 DIAGNOSIS — S41112A Laceration without foreign body of left upper arm, initial encounter: Secondary | ICD-10-CM

## 2021-08-13 DIAGNOSIS — Z7984 Long term (current) use of oral hypoglycemic drugs: Secondary | ICD-10-CM | POA: Insufficient documentation

## 2021-08-13 DIAGNOSIS — Z7982 Long term (current) use of aspirin: Secondary | ICD-10-CM | POA: Insufficient documentation

## 2021-08-13 DIAGNOSIS — Z794 Long term (current) use of insulin: Secondary | ICD-10-CM | POA: Insufficient documentation

## 2021-08-13 DIAGNOSIS — J449 Chronic obstructive pulmonary disease, unspecified: Secondary | ICD-10-CM | POA: Insufficient documentation

## 2021-08-13 DIAGNOSIS — Z79899 Other long term (current) drug therapy: Secondary | ICD-10-CM | POA: Diagnosis not present

## 2021-08-13 DIAGNOSIS — W01198A Fall on same level from slipping, tripping and stumbling with subsequent striking against other object, initial encounter: Secondary | ICD-10-CM | POA: Diagnosis not present

## 2021-08-13 DIAGNOSIS — Z23 Encounter for immunization: Secondary | ICD-10-CM | POA: Diagnosis not present

## 2021-08-13 DIAGNOSIS — I10 Essential (primary) hypertension: Secondary | ICD-10-CM | POA: Insufficient documentation

## 2021-08-13 DIAGNOSIS — W19XXXA Unspecified fall, initial encounter: Secondary | ICD-10-CM

## 2021-08-13 DIAGNOSIS — S51812A Laceration without foreign body of left forearm, initial encounter: Secondary | ICD-10-CM | POA: Insufficient documentation

## 2021-08-13 DIAGNOSIS — Z7951 Long term (current) use of inhaled steroids: Secondary | ICD-10-CM | POA: Insufficient documentation

## 2021-08-13 DIAGNOSIS — S59912A Unspecified injury of left forearm, initial encounter: Secondary | ICD-10-CM | POA: Diagnosis present

## 2021-08-13 DIAGNOSIS — E119 Type 2 diabetes mellitus without complications: Secondary | ICD-10-CM | POA: Diagnosis not present

## 2021-08-13 MED ORDER — TETANUS-DIPHTH-ACELL PERTUSSIS 5-2.5-18.5 LF-MCG/0.5 IM SUSY
0.5000 mL | PREFILLED_SYRINGE | Freq: Once | INTRAMUSCULAR | Status: AC
  Administered 2021-08-13: 0.5 mL via INTRAMUSCULAR
  Filled 2021-08-13: qty 0.5

## 2021-08-13 MED ORDER — CEPHALEXIN 500 MG PO CAPS: 500.0000 mg | ORAL_CAPSULE | Freq: Three times a day (TID) | ORAL | 0 refills | Status: AC

## 2021-08-13 MED ORDER — LIDOCAINE-EPINEPHRINE (PF) 2 %-1:200000 IJ SOLN
10.0000 mL | Freq: Once | INTRAMUSCULAR | Status: DC
  Filled 2021-08-13: qty 20

## 2021-08-13 NOTE — ED Provider Notes (Signed)
[facility] EMERGENCY DEPARTMENT Provider Note   CSN: [id number] Arrival date & time: 08/13/21  1829     History  Chief Complaint  Patient presents with   Arm Injury    Matthew Montes is a 61 y.o. male.  61 year old male presents for laceration to left forearm with forearm pain.  Patient states that he tripped and fell and hit his arm on the frame of his bed last night around 10 PM.  Bleeding controlled.  Did not hit head, no LOC, not anticoagulated.  Reports some pain try to move his arm.  Last tetanus unknown.  No other injuries, complaints, concerns.       Home Medications Prior to Admission medications   Medication Sig Start Date End Date Taking? Authorizing Provider  cephALEXin (KEFLEX) 500 MG capsule Take 1 capsule (500 mg total) by mouth 3 (three) times daily for 7 days. 08/13/21 08/20/21 Yes Jeannie Fend, PA-C  acetaminophen (TYLENOL) 650 MG CR tablet Take 1,300 mg by mouth every 8 (eight) hours as needed for pain.    [provider]  Acetylcysteine 600 MG CAPS Take 600 mg by mouth 2 (two) times daily.    [provider]  aspirin EC 81 MG tablet Take 81 mg by mouth daily.     [provider]  atorvastatin (LIPITOR) 40 MG tablet Take 40 mg by mouth daily.    [provider]  baclofen (LIORESAL) 10 MG tablet Take 10 mg by mouth 2 (two) times daily as needed for muscle spasms.    [provider]  diclofenac sodium (VOLTAREN) 1 % GEL Apply 2 g topically 4 (four) times daily.    [provider]  esomeprazole (NEXIUM) 40 MG capsule Take 40 mg by mouth daily before breakfast. 10/07/15   [provider]  ferrous gluconate (FERGON) 324 MG tablet Take 324 mg by mouth daily with breakfast.    [provider]  fluocinonide cream (LIDEX) 0.05 % Apply 1 application topically 2 (two) times daily as needed (for itching).    [provider]  FLUoxetine (PROZAC) 20 MG capsule Take 60 mg by  mouth daily.    [provider]  fluticasone (FLONASE) 50 MCG/ACT nasal spray Place 1 spray into both nostrils daily. 12/12/18   Dahlia Byes A, NP  gabapentin (NEURONTIN) 300 MG capsule Take 900 mg by mouth 3 (three) times daily. 05/07/18   [provider]  LANTUS SOLOSTAR 100 UNIT/ML Solostar Pen Inject 10-40 Units into the skin See admin instructions. Use 10 units every morning and use 40 units every evening 10/07/15   [provider]  liraglutide (VICTOZA) 18 MG/3ML SOPN Inject 1.8 mg into the skin daily.    [provider]  lisinopril (PRINIVIL,ZESTRIL) 10 MG tablet Take 10 mg by mouth daily.    [provider]  loperamide (IMODIUM A-D) 2 MG tablet Take 1 tablet (2 mg total) by mouth 4 (four) times daily as needed for diarrhea or loose stools. 06/19/16   Rockne Menghini, MD  loperamide (IMODIUM) 2 MG capsule Take 2 mg by mouth 4 (four) times daily as needed for diarrhea or loose stools.    [provider]  Magnesium 250 MG TABS Take 750 mg by mouth daily.    [provider]  meclizine (ANTIVERT) 25 MG tablet Take 1 tablet (25 mg total) by mouth 3 (three) times daily as needed for dizziness. 12/12/18   Janace Aris, NP  Melatonin 3 MG TABS  Take 9 mg by mouth at bedtime.    [provider]  metFORMIN (GLUCOPHAGE-XR) 500 MG 24 hr tablet Take 1,000 mg by mouth 2 (two) times daily.    [provider]  metoprolol succinate (TOPROL-XL) 50 MG 24 hr tablet Take 50 mg by mouth daily.  10/07/15   [provider]  Multiple Vitamin (MULTIVITAMIN WITH MINERALS) TABS tablet Take 1 tablet by mouth daily.    [provider]  mupirocin ointment (BACTROBAN) 2 % Apply 1 application topically 2 (two) times daily as needed (skin care).     [provider]  ondansetron (ZOFRAN ODT) 8 MG disintegrating tablet Take 1 tablet (8 mg total) by mouth every 8 (eight) hours as needed for nausea or vomiting. 05/12/18    Jola Schmidt, MD  ondansetron (ZOFRAN) 4 MG tablet Take 4 mg by mouth every 8 (eight) hours as needed for nausea or vomiting.    [provider]  oxyCODONE-acetaminophen (PERCOCET/ROXICET) 5-325 MG tablet Take by mouth every 4 (four) hours as needed for severe pain.    [provider]  predniSONE (DELTASONE) 10 MG tablet Label  & dispense according to the schedule below. 5 Pills PO for 1 day then, 4 Pills PO for 1 day, 3 Pills PO for 1 day, 2 Pills PO for 1 day, 1 Pill PO for 1 days then STOP. 10/11/15   Henreitta Leber, MD  QUEtiapine (SEROQUEL) 200 MG tablet Take 400 mg by mouth at bedtime.    [provider]  sodium chloride (OCEAN) 0.65 % SOLN nasal spray Place 1 spray into both nostrils as needed for congestion. 04/22/18   Zigmund Gottron, NP  tamsulosin (FLOMAX) 0.4 MG CAPS capsule Take 0.8 mg by mouth daily.  10/07/15   [provider]  vitamin B-12 (CYANOCOBALAMIN) 1000 MCG tablet Take 1,000 mcg by mouth daily.    [provider]      Allergies    Patient has no known allergies.    Review of Systems   Review of Systems Negative except as per HPI Physical Exam Updated Vital Signs BP (!) 109/56 (BP Location: Right Arm)   Pulse 79   Temp 98.6 F (37 C) (Oral)   Resp 16   Ht 5\' 11"  (1.803 m)   Wt 104 kg   SpO2 97%   BMI 31.98 kg/m  Physical Exam Vitals and nursing note reviewed.  Constitutional:      General: He is not in acute distress.    Appearance: He is well-developed. He is not diaphoretic.  HENT:     Head: Normocephalic and atraumatic.  Cardiovascular:     Pulses: Normal pulses.  Pulmonary:     Effort: Pulmonary effort is normal.  Musculoskeletal:        General: Swelling, tenderness and signs of injury present. No deformity.     Comments: Approximately 10 cm laceration to the ulnar aspect of the midforearm with local swelling.  Bleeding controlled.  Sensation intact distal to the injury.  Normal range of motion of elbow  and wrist.  Skin:    General: Skin is warm and dry.     Findings: No erythema or rash.  Neurological:     Mental Status: He is alert and oriented to person, place, and time.     Sensory: No sensory deficit.     Motor: No weakness.  Psychiatric:        Behavior: Behavior normal.      ED Results / Procedures /  Treatments   Labs (all labs ordered are listed, but only abnormal results are displayed) Labs Reviewed - No data to display  EKG None  Radiology DG Forearm Left  Result Date: 08/13/2021 CLINICAL DATA:  61 year old male status post fall with laceration. EXAM: LEFT FOREARM - 2 VIEW COMPARISON:  None. FINDINGS: Soft tissue swelling and irregularity along the medial and dorsal forearm. Trace soft tissue gas. Underlying left radius and ulna appear intact. Normal bone mineralization. Alignment appears preserved at the left elbow and wrist. No radiopaque foreign body identified. IMPRESSION: Soft tissue injury with no acute osseous abnormality or radiopaque foreign body identified. Electronically Signed   By: Genevie Ann M.D.   On: 08/13/2021 11:05    Procedures .Marland KitchenLaceration Repair  Date/Time: 08/13/2021 11:55 AM  Performed by: Tacy Learn, PA-C Authorized by: Tacy Learn, PA-C   Consent:    Consent obtained:  Verbal   Consent given by:  Patient   Risks discussed:  Infection, need for additional repair, pain, poor cosmetic result and poor wound healing   Alternatives discussed:  No treatment and delayed treatment Universal protocol:    Procedure explained and questions answered to patient or proxy's satisfaction: yes     Relevant documents present and verified: yes     Test results available: yes     Imaging studies available: yes     Required blood products, implants, devices, and special equipment available: yes     Site/side marked: yes     Immediately prior to procedure, a time out was called: yes     Patient identity confirmed:  Verbally with patient Anesthesia:     Anesthesia method:  Local infiltration   Local anesthetic:  Lidocaine 2% WITH epi Laceration details:    Location:  Shoulder/arm   Shoulder/arm location:  L lower arm   Length (cm):  10   Depth (mm):  10 Pre-procedure details:    Preparation:  Patient was prepped and draped in usual sterile fashion and imaging obtained to evaluate for foreign bodies Exploration:    Hemostasis achieved with:  Epinephrine   Imaging obtained: x-ray     Imaging outcome: foreign body not noted     Wound extent: no foreign bodies/material noted, no muscle damage noted, no nerve damage noted, no tendon damage noted and no underlying fracture noted   Treatment:    Area cleansed with:  Saline   Amount of cleaning:  Extensive   Irrigation solution:  Sterile saline Skin repair:    Repair method:  Staples   Number of staples:  7 Approximation:    Approximation:  Loose Repair type:    Repair type:  Simple Post-procedure details:    Dressing:  Bulky dressing   Procedure completion:  Tolerated well, no immediate complications     Medications Ordered in ED Medications  lidocaine-EPINEPHrine (XYLOCAINE W/EPI) 2 %-1:200000 (PF) injection 10 mL (has no administration in time range)  Tdap (BOOSTRIX) injection 0.5 mL (0.5 mLs Intramuscular Given 08/13/21 1151)    ED Course/ Medical Decision Making/ A&P                           Medical Decision Making Amount and/or Complexity of Data Reviewed Radiology: ordered.  Risk Prescription drug management.   61 year old male with past medical history of diabetes, hypertension, COPD, liver failure and renal insufficiency with complaint of laceration to left forearm which occurred roughly 12 hours prior to arrival when he had a  mechanical fall, tripped and hit his arm on the frame of his bed.  Bleeding was controlled at home.  Presented to the ER for wound today.  On exam, has a roughly 10 cm linear laceration to the left forearm, ulnar aspect.  Sensation and  strength intact distal to the injury, brisk capillary refill present.  Bleeding controlled.  X-ray as ordered interpreted by me is negative for fracture or retained foreign body, agree with radiologist interpretation.  Wound was anesthetized with lidocaine with epi, thoroughly irrigated and visualized through range of motion.  No obvious muscle or tendon involvement.  Wound was loosely closed with staples.  Discussed with patient concern for developing infection due to length of time and delaying care prior to closure.  Plan is to start Keflex, this was sent to his pharmacy.  Patient is encouraged to recheck with his PCP in the next 2 to 3 days for a wound check with staple removal in the next 12 days or so.  Return to ER at anytime for worsening or concerning symptoms.        Final Clinical Impression(s) / ED Diagnoses Final diagnoses:  Laceration of left upper extremity, initial encounter  Fall, initial encounter    Rx / DC Orders ED Discharge Orders          Ordered    cephALEXin (KEFLEX) 500 MG capsule  3 times daily        08/13/21 1141              Jeannie Fend, PA-C 08/13/21 1157    Edwin Dada P, DO 08/20/21 0730

## 2021-08-13 NOTE — Discharge Instructions (Addendum)
Wound check with your doctor Monday. Return to the ER for any concerning symptoms. Staple removal in 12 days with your doctor's office or at urgent care.  Take Keflex as prescribed and complete the full course. This is to try and prevent infection from delayed wound closure today.

## 2021-08-13 NOTE — ED Triage Notes (Signed)
Pt arrives POV for eval of L forearm laceration. Pt reports he fell and struck his arm on his bedframe last night at 10PM. Wound is 4-5 inches in length, hemostatic in triage. Dsg applied by EMT

## 2021-08-24 ENCOUNTER — Encounter (HOSPITAL_COMMUNITY): Payer: Self-pay | Admitting: Emergency Medicine

## 2021-08-24 ENCOUNTER — Ambulatory Visit (HOSPITAL_COMMUNITY)
Admission: EM | Admit: 2021-08-24 | Discharge: 2021-08-24 | Disposition: A | Discharge status: Home / Self Care | Payer: Medicare Other

## 2021-08-24 DIAGNOSIS — Z4802 Encounter for removal of sutures: Secondary | ICD-10-CM

## 2021-08-24 DIAGNOSIS — S41112D Laceration without foreign body of left upper arm, subsequent encounter: Secondary | ICD-10-CM

## 2021-08-24 MED ORDER — BACITRACIN ZINC 500 UNIT/GM EX OINT
TOPICAL_OINTMENT | CUTANEOUS | Status: AC
  Filled 2021-08-24: qty 0.9

## 2021-08-24 NOTE — Discharge Instructions (Addendum)
May wash the area with soap and water later this evening. Recommend apply Vaseline to wound area to keep area slightly moist to help with scab removal. Keep area wrapped with gauze and Coban when working. Follow-up if any increased redness, swelling or discharge occurs.

## 2021-08-24 NOTE — ED Provider Notes (Signed)
[facility]    CSN: [id number] Arrival date & time: 08/24/21  2979      History   Chief Complaint Chief Complaint  Patient presents with   Suture / Staple Removal    HPI Matthew Montes is a 61 y.o. male.   61 year old male presents for staple removal from laceration on his left lower arm that occurred on 08/12/2021. He went to the ER the next morning since the area was still bleeding and open. The ER applied 7 staples to his lower arm, updated his Tetanus booster and placed him on Keflex for 7 days. He has returned after 11 days for staple removal. Denies any pain, redness, or discharge from wound. Does have some scab formation on proximal area of wound and uncertain if any sutures present under scab. Is diabetic and on insulin. No other concerns.   The history is provided by the patient.    Past Medical History:  Diagnosis Date   ADHD (attention deficit hyperactivity disorder)    Asthma    Cardiomegaly    Chronic back pain    Chronic neck pain    COPD (chronic obstructive pulmonary disease) (HCC)    Diabetes mellitus without complication (HCC)    Hypertension    Liver failure (HCC)    Renal disorder    Renal insufficiency    Schizophrenia [facility])     Patient Active Problem List   Diagnosis Date Noted   Cellulitis of submandibular region 10/10/2015    Past Surgical History:  Procedure Laterality Date   BACK SURGERY     NECK SURGERY     SPINAL FUSION         Home Medications    Prior to Admission medications   Medication Sig Start Date End Date Taking? Authorizing Provider  acetaminophen (TYLENOL) 650 MG CR tablet Take 1,300 mg by mouth every 8 (eight) hours as needed for pain.    [provider]  Acetylcysteine 600 MG CAPS Take 600 mg by mouth 2 (two) times daily.    [provider]  aspirin EC 81 MG tablet Take 81 mg by mouth daily.     [provider]  atorvastatin (LIPITOR) 40 MG tablet Take 40 mg by mouth daily.     [provider]  baclofen (LIORESAL) 10 MG tablet Take 10 mg by mouth 2 (two) times daily as needed for muscle spasms.    [provider]  diclofenac sodium (VOLTAREN) 1 % GEL Apply 2 g topically 4 (four) times daily.    [provider]  esomeprazole (NEXIUM) 40 MG capsule Take 40 mg by mouth daily before breakfast. 10/07/15   [provider]  ferrous gluconate (FERGON) 324 MG tablet Take 324 mg by mouth daily with breakfast.    [provider]  fluocinonide cream (LIDEX) 0.05 % Apply 1 application topically 2 (two) times daily as needed (for itching).    [provider]  FLUoxetine (PROZAC) 20 MG capsule Take 60 mg by mouth daily.    [provider]  fluticasone (FLONASE) 50 MCG/ACT nasal spray Place 1 spray into both nostrils daily. 12/12/18   Dahlia Byes A, NP  gabapentin (NEURONTIN) 300 MG capsule Take 900 mg by mouth 3 (three) times daily. 05/07/18   [provider]  LANTUS SOLOSTAR 100 UNIT/ML Solostar Pen Inject 10-40 Units into the skin See admin instructions. Use 10 units every morning and use 40 units every evening 10/07/15   [provider]  liraglutide (VICTOZA) 18 MG/3ML SOPN Inject 1.8 mg into the skin daily.    [provider]  lisinopril (PRINIVIL,ZESTRIL) 10 MG tablet Take 10 mg by mouth daily.    [provider]  loperamide (IMODIUM A-D) 2 MG tablet Take 1 tablet (2 mg total) by mouth 4 (four) times daily as needed for diarrhea or loose stools. 06/19/16   Rockne Menghini, MD  loperamide (IMODIUM) 2 MG capsule Take 2 mg by mouth 4 (four) times daily as needed for diarrhea or loose stools.    [provider]  Magnesium 250 MG TABS Take 750 mg by mouth daily.    [provider]  meclizine (ANTIVERT) 25 MG tablet Take 1 tablet (25 mg total) by mouth 3 (three) times daily as needed for dizziness. 12/12/18   Dahlia Byes A, NP  Melatonin 3 MG TABS Take 9 mg by mouth at  bedtime.    [provider]  metFORMIN (GLUCOPHAGE-XR) 500 MG 24 hr tablet Take 1,000 mg by mouth 2 (two) times daily.    [provider]  metoprolol succinate (TOPROL-XL) 50 MG 24 hr tablet Take 50 mg by mouth daily.  10/07/15   [provider]  Multiple Vitamin (MULTIVITAMIN WITH MINERALS) TABS tablet Take 1 tablet by mouth daily.    [provider]  mupirocin ointment (BACTROBAN) 2 % Apply 1 application topically 2 (two) times daily as needed (skin care).     [provider]  ondansetron (ZOFRAN) 4 MG tablet Take 4 mg by mouth every 8 (eight) hours as needed for nausea or vomiting.    [provider]  oxyCODONE-acetaminophen (PERCOCET/ROXICET) 5-325 MG tablet Take by mouth every 4 (four) hours as needed for severe pain.    [provider]  QUEtiapine (SEROQUEL) 200 MG tablet Take 400 mg by mouth at bedtime.    [provider]  sodium chloride (OCEAN) 0.65 % SOLN nasal spray Place 1 spray into both nostrils as needed for congestion. 04/22/18   Georgetta Haber, NP  tamsulosin (FLOMAX) 0.4 MG CAPS capsule Take 0.8 mg by mouth daily.  10/07/15   [provider]  vitamin B-12 (CYANOCOBALAMIN) 1000 MCG tablet Take 1,000 mcg by mouth daily.    [provider]    Family History Family History  Problem Relation Age of Onset   Heart attack Mother    Heart disease Father    Diabetes Father     Social History Social History   Tobacco Use   Smoking status: Former   Smokeless tobacco: Current    Types: Chew  Substance Use Topics   Alcohol use: Yes    Comment: 3 drinks/month.      Allergies   Patient has no known allergies.   Review of Systems Review of Systems  Constitutional:  Negative for chills and fever.  Skin:  Positive for wound (healing). Negative for color change.  Allergic/Immunologic: Positive for environmental allergies. Negative for food allergies.  Neurological:  Negative for tremors  and numbness.  Hematological:  Negative for adenopathy. Bruises/bleeds easily.     Physical Exam Triage Vital Signs ED Triage Vitals  Enc Vitals Group     BP 08/24/21 1032 (!) 148/81     Pulse Rate 08/24/21 1032 73     Resp 08/24/21 1032 17     Temp --      Temp src --      SpO2 08/24/21 1032 100 %     Weight --  Height --      Head Circumference --      Peak Flow --      Pain Score 08/24/21 1033 3     Pain Loc --      Pain Edu? --      Excl. in GC? --    No data found.  Updated Vital Signs BP (!) 148/81   Pulse 73   Resp 17   SpO2 100%   Visual Acuity Right Eye Distance:   Left Eye Distance:   Bilateral Distance:    Right Eye Near:   Left Eye Near:    Bilateral Near:     Physical Exam Vitals and nursing note reviewed.  Constitutional:      General: He is awake. He is not in acute distress.    Appearance: He is well-developed and well-groomed.     Comments: He is sitting comfortably on the exam table in no acute distress.   HENT:     Head: Normocephalic and atraumatic.     Right Ear: Hearing normal.     Left Ear: Hearing normal.  Cardiovascular:     Rate and Rhythm: Normal rate.  Pulmonary:     Effort: Pulmonary effort is normal.  Musculoskeletal:        General: Normal range of motion.     Left forearm: Laceration present. No swelling or tenderness.       Arms:     Comments: Wound is well approximated. 7 stapes in place. Significant raised scab formation present at proximal end of wound- no distinct staple visualized in scab. No bleeding, redness or swelling. No discharge.   Skin:    General: Skin is warm.     Capillary Refill: Capillary refill takes less than 2 seconds.     Findings: Bruising present. No erythema or rash.  Neurological:     General: No focal deficit present.     Mental Status: He is alert and oriented to person, place, and time.     Sensory: Sensation is intact. No sensory deficit.     Motor: Motor function is intact.   Psychiatric:        Mood and Affect: Mood normal.        Behavior: Behavior normal. Behavior is cooperative.        Thought Content: Thought content normal.        Judgment: Judgment normal.      UC Treatments / Results  Labs (all labs ordered are listed, but only abnormal results are displayed) Labs Reviewed - No data to display  EKG   Radiology No results found.  Procedures Procedures (including critical care time)  Medications Ordered in UC Medications - No data to display  Initial Impression / Assessment and Plan / UC Course  I have reviewed the triage vital signs and the nursing notes.  Pertinent labs & imaging results that were available during my care of the patient were reviewed by me and considered in my medical decision making (see chart for details).     Torrie, CMA, removed 7 staples with success. Some discomfort from procedure but patient tolerated it well. No bleeding. Did not remove scab at proximal end of wound since wound appears well approximated. Discussed with patient that after scab falls off on own, if any staple or metal is still present in skin, return to Urgent Care for removal.  Applied Bacitracin ointment to area and covered with gauze and Coban. Recommend wash area with soap and water  later today (8 - 12 hours later) and may keep uncovered at night. Recommend apply Vaseline or Aquaphor to scab. Keep area covered by clothing or Coban while at work until completely healed. Follow-up if any redness, swelling or discharge occurs or any foreign material (staple) remains after scab falls off.  Final Clinical Impressions(s) / UC Diagnoses   Final diagnoses:  Encounter for removal of staples  Laceration of left upper extremity, subsequent encounter     Discharge Instructions      May wash the area with soap and water later this evening. Recommend apply Vaseline to wound area to keep area slightly moist to help with scab removal. Keep area wrapped  with gauze and Coban when working. Follow-up if any increased redness, swelling or discharge occurs.     ED Prescriptions   None    PDMP not reviewed this encounter.   Sudie Grumbling, NP 08/25/21 1700

## 2021-08-24 NOTE — ED Notes (Signed)
Seven staples were removed for patient left forearm. Pt tolerated well. Provider overlooked to make sure there were no complications

## 2021-08-24 NOTE — ED Triage Notes (Signed)
Pt is present today for staple removal on the left forearm

## 2024-01-02 ENCOUNTER — Other Ambulatory Visit: Payer: Self-pay | Admitting: Adult Health Nurse Practitioner

## 2024-01-02 ENCOUNTER — Ambulatory Visit
Admission: RE | Admit: 2024-01-02 | Discharge: 2024-01-02 | Disposition: A | Discharge status: Home / Self Care | Source: Ambulatory Visit | Attending: Adult Health Nurse Practitioner | Admitting: Adult Health Nurse Practitioner

## 2024-01-02 DIAGNOSIS — M25551 Pain in right hip: Secondary | ICD-10-CM

## 2024-01-14 NOTE — Progress Notes (Signed)
 New Patient Pulmonology Office Visit   Subjective:  Patient ID: Matthew Montes, male    DOB: 24-Jun-1960  MRN: [id number]  Referred by: Stephen Rojelio PARAS, NP  CC: No chief complaint on file.   HPI Matthew Montes is a 63 y.o. male with ***  OSA on CPAP, GERD, and chronic pain d/t multiple spine surgeries who established with [facility] in Oct 2021 for dyspnea. At his initial appointment, he reported a ~7 year history of dyspnea. At some point he was started on symbicort and nocturnal oxygen through his CPAP,   Seen by pulm on 11/2022 - 40 y smoking history, CAD, chronic dyspnea on exertion. Normal PFTs and CT scan not demostrate emphysema. nitial PFT was negative for obstruction. He was continued on his symbicort pending methacholine challenge test. Jan 2022: His breathing remained largely unchanged on symbicort and PRN albuterol . Methacholine challenge test and V/Q scan were ordered. Jun 2022: V/Q scan detected subsegmental PE, treated with 3 months anticoagulation per Hematology. Remained on inhalers and nightly CPAP.   Discussed the use of AI scribe software for clinical note transcription with the patient, who gave verbal consent to proceed.  History of Present Illness    {PULM QUESTIONNAIRES (Optional):33196}  ROS  Allergies: Patient has no known allergies.  Current Outpatient Medications:    acetaminophen  (TYLENOL ) 650 MG CR tablet, Take 1,300 mg by mouth every 8 (eight) hours as needed for pain., Disp: , Rfl:    Acetylcysteine 600 MG CAPS, Take 600 mg by mouth 2 (two) times daily., Disp: , Rfl:    aspirin  EC 81 MG tablet, Take 81 mg by mouth daily. , Disp: , Rfl:    atorvastatin  (LIPITOR) 40 MG tablet, Take 40 mg by mouth daily., Disp: , Rfl:    baclofen (LIORESAL) 10 MG tablet, Take 10 mg by mouth 2 (two) times daily as needed for muscle spasms., Disp: , Rfl:    diclofenac  sodium (VOLTAREN ) 1 % GEL, Apply 2 g topically 4 (four) times daily., Disp: , Rfl:     esomeprazole (NEXIUM) 40 MG capsule, Take 40 mg by mouth daily before breakfast., Disp: , Rfl:    ferrous gluconate (FERGON) 324 MG tablet, Take 324 mg by mouth daily with breakfast., Disp: , Rfl:    fluocinonide cream (LIDEX) 0.05 %, Apply 1 application topically 2 (two) times daily as needed (for itching)., Disp: , Rfl:    FLUoxetine (PROZAC) 20 MG capsule, Take 60 mg by mouth daily., Disp: , Rfl:    fluticasone  (FLONASE ) 50 MCG/ACT nasal spray, Place 1 spray into both nostrils daily., Disp: 16 g, Rfl: 2   gabapentin (NEURONTIN) 300 MG capsule, Take 900 mg by mouth 3 (three) times daily., Disp: , Rfl:    LANTUS  SOLOSTAR 100 UNIT/ML Solostar Pen, Inject 10-40 Units into the skin See admin instructions. Use 10 units every morning and use 40 units every evening, Disp: , Rfl:    liraglutide (VICTOZA) 18 MG/3ML SOPN, Inject 1.8 mg into the skin daily., Disp: , Rfl:    lisinopril  (PRINIVIL ,ZESTRIL ) 10 MG tablet, Take 10 mg by mouth daily., Disp: , Rfl:    loperamide  (IMODIUM  A-D) 2 MG tablet, Take 1 tablet (2 mg total) by mouth 4 (four) times daily as needed for diarrhea or loose stools., Disp: 12 tablet, Rfl: 0   loperamide  (IMODIUM ) 2 MG capsule, Take 2 mg by mouth 4 (four) times daily as needed for diarrhea or loose stools., Disp: , Rfl:    Magnesium  250 MG TABS, Take 750 mg by mouth daily., Disp: , Rfl:    meclizine  (ANTIVERT ) 25 MG tablet, Take 1 tablet (25 mg total) by mouth 3 (three) times daily as needed for dizziness., Disp: 30 tablet, Rfl: 0   Melatonin 3 MG TABS, Take 9 mg by mouth at bedtime., Disp: , Rfl:    metFORMIN  (GLUCOPHAGE -XR) 500 MG 24 hr tablet, Take 1,000 mg by mouth 2 (two) times daily., Disp: , Rfl:    metoprolol  succinate (TOPROL -XL) 50 MG 24 hr tablet, Take 50 mg by mouth daily. , Disp: , Rfl:    Multiple Vitamin (MULTIVITAMIN WITH MINERALS) TABS tablet, Take 1 tablet by mouth daily., Disp: , Rfl:    mupirocin ointment (BACTROBAN) 2 %, Apply 1 application topically 2 (two)  times daily as needed (skin care). , Disp: , Rfl:    ondansetron  (ZOFRAN ) 4 MG tablet, Take 4 mg by mouth every 8 (eight) hours as needed for nausea or vomiting., Disp: , Rfl:    oxyCODONE-acetaminophen  (PERCOCET/ROXICET) 5-325 MG tablet, Take by mouth every 4 (four) hours as needed for severe pain., Disp: , Rfl:    QUEtiapine (SEROQUEL) 200 MG tablet, Take 400 mg by mouth at bedtime., Disp: , Rfl:    sodium chloride  (OCEAN) 0.65 % SOLN nasal spray, Place 1 spray into both nostrils as needed for congestion., Disp: 60 mL, Rfl: 0   tamsulosin  (FLOMAX ) 0.4 MG CAPS capsule, Take 0.8 mg by mouth daily. , Disp: , Rfl:    vitamin B-12 (CYANOCOBALAMIN) 1000 MCG tablet, Take 1,000 mcg by mouth daily., Disp: , Rfl:  Past Medical History:  Diagnosis Date   ADHD (attention deficit hyperactivity disorder)    Asthma    Cardiomegaly    Chronic back pain    Chronic neck pain    COPD (chronic obstructive pulmonary disease) (HCC)    Diabetes mellitus without complication (HCC)    Hypertension    Liver failure (HCC)    Renal disorder    Renal insufficiency    Schizophrenia (HCC)    Past Surgical History:  Procedure Laterality Date   BACK SURGERY     NECK SURGERY     SPINAL FUSION     Family History  Problem Relation Age of Onset   Heart attack Mother    Heart disease Father    Diabetes Father    Social History   Socioeconomic History   Marital status: Single    Spouse name: Not on file   Number of children: Not on file   Years of education: Not on file   Highest education level: Not on file  Occupational History   Not on file  Tobacco Use   Smoking status: Former   Smokeless tobacco: Current    Types: Chew  Substance and Sexual Activity   Alcohol use: Yes    Comment: 3 drinks/month.    Drug use: Not on file   Sexual activity: Not on file  Other Topics Concern   Not on file  Social History Narrative   Not on file   Social Drivers of Health   Financial Resource Strain: Low Risk  (03/13/2023)   Received from [facility]   Overall Financial Resource Strain (CARDIA)    Difficulty of Paying Living Expenses: Not hard at all  Food Insecurity: No Food Insecurity (03/13/2023)   Received from [facility]   Hunger Vital Sign    Within the past 12 months, you worried that your food would run out  before you got the money to buy more.: Never true    Within the past 12 months, the food you bought just didn't last and you didn't have money to get more.: Never true  Transportation Needs: No Transportation Needs (03/13/2023)   Received from [facility] - Transportation    Lack of Transportation (Medical): No    Lack of Transportation (Non-Medical): No  Physical Activity: Inactive (07/17/2023)   Received from [facility]   Exercise Vital Sign    On average, how many days per week do you engage in moderate to strenuous exercise (like a brisk walk)?: 0 days    On average, how many minutes do you engage in exercise at this level?: 0 min  Stress: No Stress Concern Present (03/13/2023)   Received from [facility] of Occupational [facility] - Occupational Stress Questionnaire    Feeling of Stress : Not at all  Social Connections: Not on file  Intimate Partner Violence: Not At Risk (12/13/2021)   Received from [facility]   Humiliation, Afraid, Rape, and Kick questionnaire    Within the last year, have you been afraid of your partner or ex-partner?: No    Within the last year, have you been humiliated or emotionally abused in other ways by your partner or ex-partner?: No    Within the last year, have you been kicked, hit, slapped, or otherwise physically hurt by your partner or ex-partner?: No    Within the last year, have you been raped or forced to have any kind of sexual activity by your partner or ex-partner?: No    FMHX - Cancers (breast/uterine in sister) - Cardiovascular disease (multiple) - Asthma (brother)  Social history  notable for:  Home: Lives alone in a rent-adjusted apartment in [place]. Has dog at home.  Employment: Used to work in trucking, on disability since crashing. No industrial or agricultural exposures. No sandblasting. Substances: 40 PY (started at 63 yo, 1 ppd, quit at 63 yo)   Pulmonary Function Tests: Oct 2017: No obstruction. Lung volume with moderate restrictive process. DLCO mildly reduced. Jul 2021: No obstruction. No bronchodilator response. Lung volume with mild restrictive process (TLC < LLN). No DLCO.  Pertinent Imaging Data from [facility] 09/2023: CT lung cancer screening No emphysema. Unchanged 4 mm right lower lobe perifissural nodule (6:259). Unchanged 4 mm left lower lobe nodule (6:382) other pulmonary nodules are classified but remain unchanged. No suspicious pulmonary nodules. No pleural effusion or pneumothorax.  Lung RADS: 2-LDCT1YR : 2 Benign imaging features or indolent behavior  -Based on: Similar size and number multiple pulmonary nodules, including a 4 mm solid nodule in the lateral segment of the middle lobe of the right lung. (Image 281 series 3)  -Recommendations: Continued annual lung cancer screening which utilizes low dose CT imaging of the chest  -Follow-up date: September 15, 2024 or soon thereafter    Dec 2020 CT chest: Clear central tracheobronchial tree. Interval resolution of previously seen bilateral upper lobe and right middle nodular and groundglass opacities. Stable scattered 0.3 to 0.4 cm nodules. Nov 2021 CXR: Clear. Feb 2022 V/Q scan: Previously described area of decreased uptake in the right mid lung corresponds to an area of wedge shaped defect in the right middle lobe, concerning for small, subsegmental PE.  Other Pertinent Data: Nov 2018 Polysomnography: CPAP 12 cm H20. Aug 2021 TTE: LV with EF > 55%, mildly thick. RV normal size and function. Dec 2021 Stress  test: Low risk probably normal myocardial perfusion study. LVEF 63%. Nov 2022  Polysomnography: Non-diagnostic due to minimal sleep (22 mins).    Objective:  There were no vitals taken for this visit. {Pulm Vitals (Optional):32837}  Physical Exam  Diagnostic Review:  {Labs (Optional):32838}     Assessment & Plan:   Assessment & Plan   Enroll in the lung cancer screeening heere PFTs  Assessment and Plan Assessment & Plan   No follow-ups on file.    Marny Patch, MD Pulmonary and Critical Care Medicine [facility]

## 2024-01-15 ENCOUNTER — Ambulatory Visit

## 2024-01-15 ENCOUNTER — Ambulatory Visit: Admitting: Pulmonary Disease

## 2024-01-15 VITALS — BP 142/80 | HR 78 | Temp 97.6°F | Ht 71.0 in | Wt 179.0 lb

## 2024-01-15 DIAGNOSIS — J4540 Moderate persistent asthma, uncomplicated: Secondary | ICD-10-CM | POA: Diagnosis not present

## 2024-01-15 DIAGNOSIS — Z87891 Personal history of nicotine dependence: Secondary | ICD-10-CM | POA: Diagnosis not present

## 2024-01-15 DIAGNOSIS — F17200 Nicotine dependence, unspecified, uncomplicated: Secondary | ICD-10-CM

## 2024-01-15 MED ORDER — SYMBICORT 160-4.5 MCG/ACT IN AERO: 2.0000 | INHALATION_SPRAY | Freq: Two times a day (BID) | RESPIRATORY_TRACT | 10 refills | Status: AC

## 2024-01-15 NOTE — Patient Instructions (Addendum)
 Dear Mr. Elam;  I refilled your symbicort. Continue 2 puffs twice a day. I enrolled you to the lung cancer screening program here as well. I will repeat the pulmonary function test at the next appointment.   I will see you in 4 months.

## 2024-01-22 ENCOUNTER — Ambulatory Visit (INDEPENDENT_AMBULATORY_CARE_PROVIDER_SITE_OTHER): Admitting: Podiatry

## 2024-01-22 ENCOUNTER — Encounter: Payer: Self-pay | Admitting: Podiatry

## 2024-01-22 VITALS — Ht 71.0 in | Wt 179.0 lb

## 2024-01-22 DIAGNOSIS — M79675 Pain in left toe(s): Secondary | ICD-10-CM | POA: Diagnosis not present

## 2024-01-22 DIAGNOSIS — M5416 Radiculopathy, lumbar region: Secondary | ICD-10-CM

## 2024-01-22 DIAGNOSIS — Z0189 Encounter for other specified special examinations: Secondary | ICD-10-CM | POA: Diagnosis not present

## 2024-01-22 DIAGNOSIS — B351 Tinea unguium: Secondary | ICD-10-CM

## 2024-01-22 DIAGNOSIS — M79674 Pain in right toe(s): Secondary | ICD-10-CM

## 2024-01-22 DIAGNOSIS — E119 Type 2 diabetes mellitus without complications: Secondary | ICD-10-CM | POA: Diagnosis not present

## 2024-01-22 NOTE — Progress Notes (Signed)
   Chief Complaint  Patient presents with   Nail Problem    Pt is here due to bilateral toenail fungus, and  diabetic foot exam, states he has neuropathy and is a diabetic.    SUBJECTIVE Patient presents to office today complaining of elongated, thickened nails that cause pain while ambulating in shoes.  Patient is unable to trim their own nails.  Patient also has a long history of lumbar radiculopathy and multiple spine surgeries.  He complains of neuropathy to the bilateral lower extremities and believes it is from his diabetes.  Patient is here for further evaluation and treatment.  Past Medical History:  Diagnosis Date   ADHD (attention deficit hyperactivity disorder)    Asthma    Cardiomegaly    Chronic back pain    Chronic neck pain    COPD (chronic obstructive pulmonary disease) (HCC)    Diabetes mellitus without complication (HCC)    Hypertension    Liver failure (HCC)    Renal disorder    Renal insufficiency    Schizophrenia (HCC)     No Known Allergies   OBJECTIVE General Patient is awake, alert, and oriented x 3 and in no acute distress. Derm Skin is dry and supple bilateral. Negative open lesions or macerations. Remaining integument unremarkable. Nails are tender, long, thickened and dystrophic with subungual debris, consistent with onychomycosis, 1-5 bilateral. No signs of infection noted. Vasc  DP and PT pedal pulses palpable bilaterally. Temperature gradient within normal limits.  Neuro Light and protective threshold sensation grossly intact bilaterally.  Musculoskeletal Exam No symptomatic pedal deformities noted bilateral. Muscular strength within normal limits.  ASSESSMENT 1.  Pain due to onychomycosis of toenails both 2.  Encounter for diabetic foot exam 3.  Diabetic peripheral neuropathy 4.  Lumbar radiculopathy; chronic  PLAN OF CARE -Patient evaluated today.  Comprehensive diabetic foot exam performed today -Instructed to maintain good pedal hygiene  and foot care.  -Mechanical debridement of nails 1-5 bilaterally performed using a nail nipper. Filed with dremel without incident.  -I do believe much of the patient's peripheral neuropathy may be coming from his lumbar spine.  He has a history of multiple surgeries and chronic lumbar radiculopathy. -Referral placed to [facility] for evaluation.  Patient may benefit from SCS -Return to clinic PRN   Thresa EMERSON Sar, DPM [facility]  Dr. Thresa EMERSON Sar, DPM    [address] [place], [place] [address]                Office [phone]  Fax [phone]

## 2024-02-05 ENCOUNTER — Other Ambulatory Visit: Payer: Self-pay | Admitting: Orthopedic Surgery

## 2024-02-05 DIAGNOSIS — M25551 Pain in right hip: Secondary | ICD-10-CM

## 2024-02-13 ENCOUNTER — Other Ambulatory Visit

## 2024-02-23 ENCOUNTER — Ambulatory Visit
Admission: RE | Admit: 2024-02-23 | Discharge: 2024-02-23 | Disposition: A | Source: Ambulatory Visit | Attending: Orthopedic Surgery | Admitting: Orthopedic Surgery

## 2024-02-23 DIAGNOSIS — M25551 Pain in right hip: Secondary | ICD-10-CM

## 2024-05-06 ENCOUNTER — Ambulatory Visit

## 2024-05-06 ENCOUNTER — Encounter

## 2024-05-21 ENCOUNTER — Ambulatory Visit (HOSPITAL_COMMUNITY): Admit: 2024-05-21 | Admitting: Orthopedic Surgery

## 2024-05-21 SURGERY — ARTHROPLASTY, HIP, TOTAL, ANTERIOR APPROACH
Anesthesia: Spinal | Site: Hip | Laterality: Right
